# Patient Record
Sex: Female | Born: 1975 | Race: Black or African American | Hispanic: No | Marital: Single | Smoking: Former smoker
Health system: Southern US, Community
[De-identification: ages and names within clinical notes are randomized; demographics above are authoritative.]

## PROBLEM LIST (undated history)

## (undated) DIAGNOSIS — M419 Scoliosis, unspecified: Secondary | ICD-10-CM

## (undated) DIAGNOSIS — Z789 Other specified health status: Secondary | ICD-10-CM

## (undated) HISTORY — PX: NO PAST SURGERIES: SHX2092

---

## 1998-10-03 ENCOUNTER — Encounter: Payer: Self-pay | Admitting: Emergency Medicine

## 1998-10-03 ENCOUNTER — Emergency Department (HOSPITAL_COMMUNITY): Admission: EM | Admit: 1998-10-03 | Discharge: 1998-10-03 | Payer: Self-pay | Admitting: Emergency Medicine

## 1998-10-30 ENCOUNTER — Emergency Department (HOSPITAL_COMMUNITY): Admission: EM | Admit: 1998-10-30 | Discharge: 1998-10-30 | Payer: Self-pay | Admitting: Emergency Medicine

## 1999-05-26 ENCOUNTER — Inpatient Hospital Stay (HOSPITAL_COMMUNITY): Admission: AD | Admit: 1999-05-26 | Discharge: 1999-05-26 | Payer: Self-pay | Admitting: Obstetrics

## 1999-05-28 ENCOUNTER — Inpatient Hospital Stay (HOSPITAL_COMMUNITY): Admission: AD | Admit: 1999-05-28 | Discharge: 1999-05-28 | Payer: Self-pay | Admitting: Obstetrics & Gynecology

## 1999-09-06 ENCOUNTER — Ambulatory Visit (HOSPITAL_COMMUNITY): Admission: RE | Admit: 1999-09-06 | Discharge: 1999-09-06 | Payer: Self-pay | Admitting: *Deleted

## 1999-10-19 ENCOUNTER — Ambulatory Visit (HOSPITAL_COMMUNITY): Admission: RE | Admit: 1999-10-19 | Discharge: 1999-10-19 | Payer: Self-pay | Admitting: *Deleted

## 1999-10-31 ENCOUNTER — Inpatient Hospital Stay (HOSPITAL_COMMUNITY): Admission: AD | Admit: 1999-10-31 | Discharge: 1999-10-31 | Payer: Self-pay | Admitting: *Deleted

## 1999-12-17 ENCOUNTER — Inpatient Hospital Stay (HOSPITAL_COMMUNITY): Admission: AD | Admit: 1999-12-17 | Discharge: 1999-12-19 | Payer: Self-pay | Admitting: *Deleted

## 1999-12-21 ENCOUNTER — Emergency Department (HOSPITAL_COMMUNITY): Admission: EM | Admit: 1999-12-21 | Discharge: 1999-12-21 | Payer: Self-pay | Admitting: Emergency Medicine

## 2000-10-12 ENCOUNTER — Inpatient Hospital Stay (HOSPITAL_COMMUNITY): Admission: AD | Admit: 2000-10-12 | Discharge: 2000-10-12 | Payer: Self-pay | Admitting: Obstetrics & Gynecology

## 2000-12-25 ENCOUNTER — Encounter: Payer: Self-pay | Admitting: Obstetrics

## 2000-12-25 ENCOUNTER — Inpatient Hospital Stay (HOSPITAL_COMMUNITY): Admission: AD | Admit: 2000-12-25 | Discharge: 2000-12-25 | Payer: Self-pay | Admitting: Obstetrics

## 2001-01-02 ENCOUNTER — Encounter (HOSPITAL_COMMUNITY): Admission: RE | Admit: 2001-01-02 | Discharge: 2001-02-01 | Payer: Self-pay | Admitting: *Deleted

## 2001-02-06 ENCOUNTER — Encounter (HOSPITAL_COMMUNITY): Admission: RE | Admit: 2001-02-06 | Discharge: 2001-02-12 | Payer: Self-pay | Admitting: *Deleted

## 2001-02-09 ENCOUNTER — Inpatient Hospital Stay (HOSPITAL_COMMUNITY): Admission: AD | Admit: 2001-02-09 | Discharge: 2001-02-11 | Payer: Self-pay | Admitting: Obstetrics & Gynecology

## 2006-04-19 ENCOUNTER — Emergency Department: Payer: Self-pay | Admitting: Emergency Medicine

## 2007-06-23 ENCOUNTER — Emergency Department (HOSPITAL_COMMUNITY): Admission: EM | Admit: 2007-06-23 | Discharge: 2007-06-23 | Payer: Self-pay | Admitting: Emergency Medicine

## 2008-12-21 ENCOUNTER — Emergency Department (HOSPITAL_COMMUNITY): Admission: EM | Admit: 2008-12-21 | Discharge: 2008-12-21 | Payer: Self-pay | Admitting: Family Medicine

## 2009-02-17 ENCOUNTER — Ambulatory Visit (HOSPITAL_COMMUNITY): Admission: AD | Admit: 2009-02-17 | Discharge: 2009-02-17 | Payer: Self-pay | Admitting: Obstetrics & Gynecology

## 2009-02-17 ENCOUNTER — Encounter: Payer: Self-pay | Admitting: Obstetrics & Gynecology

## 2009-02-17 ENCOUNTER — Ambulatory Visit: Payer: Self-pay | Admitting: Obstetrics & Gynecology

## 2010-01-20 ENCOUNTER — Emergency Department (HOSPITAL_COMMUNITY): Admission: EM | Admit: 2010-01-20 | Discharge: 2010-01-20 | Payer: Self-pay | Admitting: Family Medicine

## 2010-08-29 NOTE — L&D Delivery Note (Signed)
Delivery Note Called for delivery attendance while other provider was occupied elsewhere. Patient noted to be completely dilated with urge to push.   At 10:08 AM a viable female was delivered via Vaginal, Spontaneous Delivery (Presentation: Right Occiput Anterior).  APGAR: 9, 9; weight 6 lb 14.4 oz (3130 g).   Placenta status: Intact, Spontaneous.  Cord: 3 vessels with the following complications: None.  Cord pH: n/a  There was no difficulty with the delivery of the shoulders.   Anesthesia: None  Episiotomy: None Lacerations: None Suture Repair: n/a Est. Blood Loss (mL):   Mom to postpartum.  Baby to nursery-stable.  [facility] 06/17/2011, 3:48 PM

## 2010-09-04 ENCOUNTER — Emergency Department (HOSPITAL_COMMUNITY)
Admission: EM | Admit: 2010-09-04 | Discharge: 2010-09-04 | Payer: Self-pay | Source: Home / Self Care | Admitting: Emergency Medicine

## 2010-09-13 LAB — URINALYSIS, ROUTINE W REFLEX MICROSCOPIC
Bilirubin Urine: NEGATIVE
Hgb urine dipstick: NEGATIVE
Ketones, ur: 15 mg/dL — AB
Nitrite: NEGATIVE
Protein, ur: NEGATIVE mg/dL
Specific Gravity, Urine: 1.023 (ref 1.005–1.030)
Urine Glucose, Fasting: NEGATIVE mg/dL
Urobilinogen, UA: 0.2 mg/dL (ref 0.0–1.0)
pH: 6 (ref 5.0–8.0)

## 2010-11-15 LAB — WET PREP, GENITAL: Yeast Wet Prep HPF POC: NONE SEEN

## 2010-11-15 LAB — POCT URINALYSIS DIP (DEVICE)
Glucose, UA: NEGATIVE mg/dL
Ketones, ur: NEGATIVE mg/dL
Nitrite: NEGATIVE
Protein, ur: 30 mg/dL — AB
Specific Gravity, Urine: >=1.03 (ref 1.005–1.030)
Urobilinogen, UA: 0.2 mg/dL (ref 0.0–1.0)
pH: 6 (ref 5.0–8.0)

## 2010-11-15 LAB — URINE CULTURE
Colony Count: NO GROWTH
Culture: NO GROWTH

## 2010-11-15 LAB — POCT PREGNANCY, URINE: Preg Test, Ur: NEGATIVE

## 2010-11-15 LAB — GC/CHLAMYDIA PROBE AMP, GENITAL
Chlamydia, DNA Probe: NEGATIVE
GC Probe Amp, Genital: NEGATIVE

## 2010-12-06 LAB — URINALYSIS, ROUTINE W REFLEX MICROSCOPIC
Bilirubin Urine: NEGATIVE
Glucose, UA: NEGATIVE mg/dL
Ketones, ur: >80 mg/dL — AB
Nitrite: NEGATIVE
Protein, ur: 30 mg/dL — AB
Specific Gravity, Urine: >1.03 — ABNORMAL HIGH (ref 1.005–1.030)
Urobilinogen, UA: 0.2 mg/dL (ref 0.0–1.0)
pH: 6 (ref 5.0–8.0)

## 2010-12-06 LAB — HERPES SIMPLEX VIRUS CULTURE: Culture: NOT DETECTED

## 2010-12-06 LAB — COMPREHENSIVE METABOLIC PANEL
ALT: 47 U/L — ABNORMAL HIGH (ref 0–35)
AST: 35 U/L (ref 0–37)
Albumin: 4.2 g/dL (ref 3.5–5.2)
Alkaline Phosphatase: 63 U/L (ref 39–117)
BUN: 7 mg/dL (ref 6–23)
CO2: 25 mEq/L (ref 19–32)
Calcium: 9.9 mg/dL (ref 8.4–10.5)
Chloride: 105 mEq/L (ref 96–112)
Creatinine, Ser: 0.42 mg/dL (ref 0.4–1.2)
GFR calc Af Amer: >60 mL/min (ref >60)
GFR calc non Af Amer: >60 mL/min (ref >60)
Glucose, Bld: 91 mg/dL (ref 70–99)
Potassium: 3.6 mEq/L (ref 3.5–5.1)
Sodium: 139 mEq/L (ref 135–145)
Total Bilirubin: 0.7 mg/dL (ref 0.3–1.2)
Total Protein: 7.5 g/dL (ref 6.0–8.3)

## 2010-12-06 LAB — DIFFERENTIAL
Basophils Absolute: 0.1 10*3/uL (ref 0.0–0.1)
Basophils Relative: 1 % (ref 0–1)
Eosinophils Absolute: 0.2 10*3/uL (ref 0.0–0.7)
Eosinophils Relative: 3 % (ref 0–5)
Lymphocytes Relative: 34 % (ref 12–46)
Lymphs Abs: 1.9 10*3/uL (ref 0.7–4.0)
Monocytes Absolute: 0.6 10*3/uL (ref 0.1–1.0)
Monocytes Relative: 10 % (ref 3–12)
Neutro Abs: 2.9 10*3/uL (ref 1.7–7.7)
Neutrophils Relative %: 52 % (ref 43–77)

## 2010-12-06 LAB — GC/CHLAMYDIA PROBE AMP, GENITAL
Chlamydia, DNA Probe: NEGATIVE
GC Probe Amp, Genital: NEGATIVE

## 2010-12-06 LAB — URINE MICROSCOPIC-ADD ON

## 2010-12-06 LAB — CBC
HCT: 38 % (ref 36.0–46.0)
Hemoglobin: 12.9 g/dL (ref 12.0–15.0)
MCHC: 33.9 g/dL (ref 30.0–36.0)
MCV: 85.8 fL (ref 78.0–100.0)
Platelets: 269 10*3/uL (ref 150–400)
RBC: 4.43 MIL/uL (ref 3.87–5.11)
RDW: 13.9 % (ref 11.5–15.5)
WBC: 5.6 10*3/uL (ref 4.0–10.5)

## 2010-12-06 LAB — WET PREP, GENITAL: Yeast Wet Prep HPF POC: NONE SEEN

## 2010-12-06 LAB — ABO/RH: ABO/RH(D): B POS

## 2010-12-06 LAB — HCG, QUANTITATIVE, PREGNANCY: hCG, Beta Chain, Quant, S: 9 m[IU]/mL — ABNORMAL HIGH (ref <5)

## 2010-12-08 LAB — POCT URINALYSIS DIP (DEVICE)
Glucose, UA: NEGATIVE mg/dL
Protein, ur: NEGATIVE mg/dL
Specific Gravity, Urine: 1.015 (ref 1.005–1.030)
Urobilinogen, UA: 1 mg/dL (ref 0.0–1.0)
pH: 7 (ref 5.0–8.0)

## 2010-12-08 LAB — URINE CULTURE

## 2010-12-29 ENCOUNTER — Other Ambulatory Visit: Payer: Self-pay | Admitting: Family Medicine

## 2010-12-29 DIAGNOSIS — Z3689 Encounter for other specified antenatal screening: Secondary | ICD-10-CM

## 2010-12-29 LAB — GC/CHLAMYDIA PROBE AMP, GENITAL: Chlamydia: NEGATIVE

## 2010-12-29 LAB — RUBELLA ANTIBODY, IGM: Rubella: IMMUNE

## 2010-12-29 LAB — ANTIBODY SCREEN: Antibody Screen: NEGATIVE

## 2011-01-11 NOTE — Op Note (Signed)
NAME:  Kristi Kelley, Kristi Kelley          ACCOUNT NO.:  [id number]   MEDICAL RECORD NO.:  [id number]          PATIENT TYPE:  AMB   LOCATION:  [facility]                          FACILITY:  [facility]   PHYSICIAN:  Lesly Dukes, M.D. DATE OF BIRTH:  1975-10-16   DATE OF PROCEDURE:  02/17/2009  DATE OF DISCHARGE:  02/17/2009                               OPERATIVE REPORT   REASON FOR PROCEDURE:  The patient is a 35 year old female, who had a  positive pregnancy test in April 2010 and never got prenatal care.  The  patient comes in today bleeding with severe abdominal pain and open os.  Ultrasound shows thickened endometrium.  It was likely retained products  of conception.   PREOPERATIVE DIAGNOSIS:  A 35 year old female with G3, P2 with retained  products of conception.   POSTOPERATIVE DIAGNOSIS:  A 35 year old female with G3, P2 with retained  products of conception.   PROCEDURE:  Dilation and evacuation.   SURGEON:  Lesly Dukes, MD   ANESTHESIA:  MAC and local.   FINDINGS:  Slightly enlarged uterus with the open os, anteverted uterus,  no adnexal masses.   PATHOLOGY:  Endometrial curettings to pathology.   ESTIMATED BLOOD LOSS:  Minimal.   COMPLICATIONS:  None.   PROCEDURE:  After informed consent was obtained, the patient was taken  to the operating room where MAC anesthesia was induced.  The patient  placed in dorsal lithotomy position and was prepared and draped in  normal sterile fashion.  The bladder was emptied.  A bivalve speculum  was placed into the patient's vagina and the anterior lip of the cervix  grasped with a single-tooth tenaculum.  The cervical os was already  dilated and 7.5 Hegar dilator easily fit through teh internal os.  A  curved 7 suction curette was gently introduced to the uterus and gentle  suction curettage was performed and sharp curette was then gently  introduced into the uterus and there was a good cry in all sides of the  uterine wall in all 4  uterine walls.  Suction curette was then done to  ensure that all products of conception were removed from the uterus.  All instruments were removed from the vagina.  There was good hemostasis  on the cervix.  The patient tolerated the procedure well.  Sponge, lap,  instrument, and needle count were correct x2.  The patient went to  recovery room in stable condition.     Lesly Dukes, M.D.  Electronically Signed    KHL/MEDQ  D:  02/17/2009  T:  02/18/2009  Job:  [id number]

## 2011-01-12 ENCOUNTER — Ambulatory Visit (HOSPITAL_COMMUNITY)
Admission: RE | Admit: 2011-01-12 | Discharge: 2011-01-12 | Disposition: A | Discharge status: Home / Self Care | Payer: Medicaid Other | Source: Ambulatory Visit | Attending: Family Medicine | Admitting: Family Medicine

## 2011-01-12 DIAGNOSIS — Z363 Encounter for antenatal screening for malformations: Secondary | ICD-10-CM | POA: Insufficient documentation

## 2011-01-12 DIAGNOSIS — O99330 Smoking (tobacco) complicating pregnancy, unspecified trimester: Secondary | ICD-10-CM | POA: Insufficient documentation

## 2011-01-12 DIAGNOSIS — O358XX0 Maternal care for other (suspected) fetal abnormality and damage, not applicable or unspecified: Secondary | ICD-10-CM | POA: Insufficient documentation

## 2011-01-12 DIAGNOSIS — Z3689 Encounter for other specified antenatal screening: Secondary | ICD-10-CM

## 2011-01-12 DIAGNOSIS — Z1389 Encounter for screening for other disorder: Secondary | ICD-10-CM | POA: Insufficient documentation

## 2011-05-31 ENCOUNTER — Encounter (HOSPITAL_COMMUNITY): Payer: Self-pay | Admitting: *Deleted

## 2011-06-07 ENCOUNTER — Encounter (HOSPITAL_COMMUNITY): Payer: Self-pay | Admitting: Anesthesiology

## 2011-06-07 ENCOUNTER — Encounter (HOSPITAL_COMMUNITY)
Admission: AD | Disposition: A | Discharge status: Home / Self Care | Payer: Self-pay | Source: Ambulatory Visit | Attending: Family Medicine

## 2011-06-07 ENCOUNTER — Inpatient Hospital Stay (HOSPITAL_COMMUNITY)
Admission: AD | Admit: 2011-06-07 | Discharge: 2011-06-08 | DRG: 767 | Disposition: A | Discharge status: Home / Self Care | Payer: Medicaid Other | Source: Ambulatory Visit | Attending: Family Medicine | Admitting: Family Medicine

## 2011-06-07 ENCOUNTER — Inpatient Hospital Stay (HOSPITAL_COMMUNITY): Payer: Medicaid Other | Admitting: Anesthesiology

## 2011-06-07 ENCOUNTER — Encounter (HOSPITAL_COMMUNITY): Payer: Self-pay | Admitting: *Deleted

## 2011-06-07 DIAGNOSIS — Z302 Encounter for sterilization: Secondary | ICD-10-CM

## 2011-06-07 DIAGNOSIS — Z3480 Encounter for supervision of other normal pregnancy, unspecified trimester: Secondary | ICD-10-CM

## 2011-06-07 HISTORY — DX: Other specified health status: Z78.9

## 2011-06-07 HISTORY — PX: TUBAL LIGATION: SHX77

## 2011-06-07 LAB — CBC
HCT: 31.2 % — ABNORMAL LOW (ref 36.0–46.0)
Platelets: 255 10*3/uL (ref 150–400)
RDW: 15.1 % (ref 11.5–15.5)
WBC: 11.2 10*3/uL — ABNORMAL HIGH (ref 4.0–10.5)

## 2011-06-07 LAB — MRSA PCR SCREENING: MRSA by PCR: NEGATIVE

## 2011-06-07 SURGERY — LIGATION, FALLOPIAN TUBE, POSTPARTUM
Anesthesia: Choice | Laterality: Bilateral

## 2011-06-07 MED ORDER — TETANUS-DIPHTH-ACELL PERTUSSIS 5-2.5-18.5 LF-MCG/0.5 IM SUSP
0.5000 mL | Freq: Once | INTRAMUSCULAR | Status: AC
  Administered 2011-06-08: 0.5 mL via INTRAMUSCULAR
  Filled 2011-06-07: qty 0.5

## 2011-06-07 MED ORDER — WITCH HAZEL-GLYCERIN EX PADS: 1.0000 "application " | MEDICATED_PAD | CUTANEOUS | Status: DC | PRN

## 2011-06-07 MED ORDER — LACTATED RINGERS IV SOLN
INTRAVENOUS | Status: DC
  Administered 2011-06-07 (×2): via INTRAVENOUS

## 2011-06-07 MED ORDER — DIBUCAINE 1 % RE OINT: 1.0000 "application " | TOPICAL_OINTMENT | RECTAL | Status: DC | PRN

## 2011-06-07 MED ORDER — HYDROMORPHONE HCL 1 MG/ML IJ SOLN
INTRAMUSCULAR | Status: AC
  Filled 2011-06-07: qty 1

## 2011-06-07 MED ORDER — EPHEDRINE SULFATE 50 MG/ML IJ SOLN
INTRAMUSCULAR | Status: DC | PRN
  Administered 2011-06-07 (×2): 10 mg via INTRAVENOUS

## 2011-06-07 MED ORDER — SIMETHICONE 80 MG PO CHEW: 80.0000 mg | CHEWABLE_TABLET | ORAL | Status: DC | PRN

## 2011-06-07 MED ORDER — BUPIVACAINE HCL (PF) 0.25 % IJ SOLN
INTRAMUSCULAR | Status: DC | PRN
  Administered 2011-06-07: 8 mL

## 2011-06-07 MED ORDER — FAMOTIDINE 20 MG PO TABS
40.0000 mg | ORAL_TABLET | Freq: Once | ORAL | Status: AC
  Administered 2011-06-07: 40 mg via ORAL
  Filled 2011-06-07: qty 2

## 2011-06-07 MED ORDER — ONDANSETRON HCL 4 MG/2ML IJ SOLN
INTRAMUSCULAR | Status: DC | PRN
  Administered 2011-06-07: 4 mg via INTRAVENOUS

## 2011-06-07 MED ORDER — SENNOSIDES-DOCUSATE SODIUM 8.6-50 MG PO TABS: 2.0000 | ORAL_TABLET | Freq: Every day | ORAL | Status: DC

## 2011-06-07 MED ORDER — ONDANSETRON HCL 4 MG/2ML IJ SOLN: 4.0000 mg | INTRAMUSCULAR | Status: DC | PRN

## 2011-06-07 MED ORDER — INFLUENZA VIRUS VACC SPLIT PF IM SUSP
0.5000 mL | Freq: Once | INTRAMUSCULAR | Status: AC
  Administered 2011-06-08: 0.5 mL via INTRAMUSCULAR
  Filled 2011-06-07: qty 0.5

## 2011-06-07 MED ORDER — DIPHENHYDRAMINE HCL 25 MG PO CAPS: 25.0000 mg | ORAL_CAPSULE | Freq: Four times a day (QID) | ORAL | Status: DC | PRN

## 2011-06-07 MED ORDER — METOCLOPRAMIDE HCL 10 MG PO TABS
10.0000 mg | ORAL_TABLET | Freq: Once | ORAL | Status: AC
  Administered 2011-06-07: 10 mg via ORAL
  Filled 2011-06-07: qty 1

## 2011-06-07 MED ORDER — NALBUPHINE SYRINGE 5 MG/0.5 ML
10.0000 mg | INJECTION | Freq: Once | INTRAMUSCULAR | Status: AC
  Administered 2011-06-07: 10 mg via INTRAVENOUS
  Filled 2011-06-07 (×2): qty 0.5

## 2011-06-07 MED ORDER — FENTANYL CITRATE 0.05 MG/ML IJ SOLN
INTRAMUSCULAR | Status: AC
  Filled 2011-06-07: qty 2

## 2011-06-07 MED ORDER — LIDOCAINE IN DEXTROSE 5-7.5 % IV SOLN
INTRAVENOUS | Status: AC
  Filled 2011-06-07: qty 2

## 2011-06-07 MED ORDER — FENTANYL CITRATE 0.05 MG/ML IJ SOLN
INTRAMUSCULAR | Status: DC | PRN
  Administered 2011-06-07 (×2): 50 ug via INTRAVENOUS

## 2011-06-07 MED ORDER — ONDANSETRON HCL 4 MG PO TABS: 4.0000 mg | ORAL_TABLET | ORAL | Status: DC | PRN

## 2011-06-07 MED ORDER — IBUPROFEN 600 MG PO TABS
600.0000 mg | ORAL_TABLET | Freq: Four times a day (QID) | ORAL | Status: DC
  Administered 2011-06-07 – 2011-06-08 (×5): 600 mg via ORAL
  Filled 2011-06-07 (×5): qty 1

## 2011-06-07 MED ORDER — EPHEDRINE 5 MG/ML INJ
INTRAVENOUS | Status: AC
  Filled 2011-06-07: qty 10

## 2011-06-07 MED ORDER — MIDAZOLAM HCL 5 MG/5ML IJ SOLN
INTRAMUSCULAR | Status: DC | PRN
  Administered 2011-06-07 (×2): 1 mg via INTRAVENOUS

## 2011-06-07 MED ORDER — KETOROLAC TROMETHAMINE 30 MG/ML IJ SOLN
INTRAMUSCULAR | Status: AC
  Filled 2011-06-07: qty 1

## 2011-06-07 MED ORDER — BENZOCAINE-MENTHOL 20-0.5 % EX AERO: 1.0000 "application " | INHALATION_SPRAY | CUTANEOUS | Status: DC | PRN

## 2011-06-07 MED ORDER — OXYCODONE-ACETAMINOPHEN 5-325 MG PO TABS
1.0000 | ORAL_TABLET | ORAL | Status: DC | PRN
  Administered 2011-06-07 – 2011-06-08 (×6): 2 via ORAL
  Filled 2011-06-07: qty 1
  Filled 2011-06-07 (×5): qty 2

## 2011-06-07 MED ORDER — HYDROMORPHONE HCL 1 MG/ML IJ SOLN
0.2500 mg | INTRAMUSCULAR | Status: DC | PRN
  Administered 2011-06-07: 0.25 mg via INTRAVENOUS

## 2011-06-07 MED ORDER — PRENATAL PLUS 27-1 MG PO TABS
1.0000 | ORAL_TABLET | Freq: Every day | ORAL | Status: DC
Start: 2011-06-07 — End: 2011-06-08
  Administered 2011-06-08: 1 via ORAL
  Filled 2011-06-07: qty 1

## 2011-06-07 MED ORDER — LANOLIN HYDROUS EX OINT: TOPICAL_OINTMENT | CUTANEOUS | Status: DC | PRN

## 2011-06-07 MED ORDER — MIDAZOLAM HCL 2 MG/2ML IJ SOLN
INTRAMUSCULAR | Status: AC
  Filled 2011-06-07: qty 2

## 2011-06-07 MED ORDER — KETOROLAC TROMETHAMINE 60 MG/2ML IM SOLN
INTRAMUSCULAR | Status: DC | PRN
  Administered 2011-06-07: 30 mg via INTRAMUSCULAR

## 2011-06-07 MED ORDER — LACTATED RINGERS IV SOLN: INTRAVENOUS | Status: DC

## 2011-06-07 MED ORDER — ONDANSETRON HCL 4 MG/2ML IJ SOLN
INTRAMUSCULAR | Status: AC
  Filled 2011-06-07: qty 2

## 2011-06-07 MED ORDER — ZOLPIDEM TARTRATE 5 MG PO TABS: 5.0000 mg | ORAL_TABLET | Freq: Every evening | ORAL | Status: DC | PRN

## 2011-06-07 MED ORDER — BUTORPHANOL TARTRATE 2 MG/ML IJ SOLN
2.0000 mg | Freq: Once | INTRAMUSCULAR | Status: AC
  Administered 2011-06-07: 2 mg via INTRAVENOUS
  Filled 2011-06-07: qty 1

## 2011-06-07 SURGICAL SUPPLY — 20 items
CHLORAPREP W/TINT 26ML (MISCELLANEOUS) ×2 IMPLANT
CLIP FILSHIE TUBAL LIGA STRL (Clip) ×2 IMPLANT
CLOTH BEACON ORANGE TIMEOUT ST (SAFETY) ×2 IMPLANT
DRAPE UTILITY XL STRL (DRAPES) ×2 IMPLANT
GLOVE BIOGEL PI IND STRL 7.0 (GLOVE) ×1 IMPLANT
GLOVE BIOGEL PI INDICATOR 7.0 (GLOVE) ×1
GLOVE ECLIPSE 7.0 STRL STRAW (GLOVE) ×4 IMPLANT
GOWN PREVENTION PLUS LG XLONG (DISPOSABLE) ×2 IMPLANT
GOWN PREVENTION PLUS XLARGE (GOWN DISPOSABLE) ×2 IMPLANT
NEEDLE HYPO 22GX1.5 SAFETY (NEEDLE) ×2 IMPLANT
NS IRRIG 1000ML POUR BTL (IV SOLUTION) ×2 IMPLANT
PACK ABDOMINAL MINOR (CUSTOM PROCEDURE TRAY) ×2 IMPLANT
SPONGE LAP 4X18 X RAY DECT (DISPOSABLE) IMPLANT
SUT VIC AB 0 CT1 27 (SUTURE) ×1
SUT VIC AB 0 CT1 27XBRD ANBCTR (SUTURE) ×1 IMPLANT
SUT VICRYL 4-0 PS2 18IN ABS (SUTURE) ×2 IMPLANT
SYR CONTROL 10ML LL (SYRINGE) ×2 IMPLANT
TOWEL OR 17X24 6PK STRL BLUE (TOWEL DISPOSABLE) ×4 IMPLANT
TRAY FOLEY CATH 14FR (SET/KITS/TRAYS/PACK) ×2 IMPLANT
WATER STERILE IRR 1000ML POUR (IV SOLUTION) ×2 IMPLANT

## 2011-06-07 NOTE — Anesthesia Procedure Notes (Signed)
Spinal Block  Patient location during procedure: OR Preanesthetic Checklist Completed: patient identified, site marked, surgical consent, pre-op evaluation, timeout performed, IV checked, risks and benefits discussed and monitors and equipment checked Spinal Block Patient position: sitting Prep: DuraPrep Patient monitoring: heart rate, cardiac monitor, continuous pulse ox and blood pressure Approach: midline Location: L4-5 Injection technique: single-shot Needle Needle type: Sprotte  Needle gauge: 24 G Needle length: 9 cm Assessment Sensory level: T4 Additional Notes Attempted at L3-4; got CSF but  No asp, and transient R leg parathesia with placement Re-prep and placed  at L45 as above Spinal Dosage in OR  Xylocaine ml       1.2

## 2011-06-07 NOTE — Procedures (Signed)
Kristi Kelley 06/07/2011  PREOPERATIVE DIAGNOSIS:  Multiparity, undesired fertility  POSTOPERATIVE DIAGNOSIS:  Multiparity, undesired fertility  PROCEDURE:  Postpartum Bilateral Tubal Sterilization using Filshie Clips   ANESTHESIA:  Spinal and local analgesia using 0.25% Bertram Gala, MD  COMPLICATIONS:  None immediate.  ESTIMATED BLOOD LOSS: 15 ml.  INDICATIONS: 35 y.o. [id number]  with undesired fertility,status post vaginal delivery, desires permanent sterilization. Risks of procedure discussed with patient including but not limited to: risk of regret, permanence of method, bleeding, infection, injury to surrounding organs and need for additional procedures.  Failure risk of 0.5-1% with increased risk of ectopic gestation if pregnancy occurs was also discussed with patient.     FINDINGS:  Normal uterus, tubes.  PROCEDURE DETAILS: The patient was taken to the operating room where her spinal anesthesia was dosed up to surgical level and found to be adequate.  She was then placed in a supine position and prepped and draped in the usual sterile fashion.  After an adequate timeout was performed, attention was turned to the patient's abdomen where a small transverse skin incision was made under the umbilical fold. The incision was taken down to the layer of fascia using the scalpel, and fascia was incised, and extended bilaterally. The peritoneum was entered in a sharp fashion. The patient was placed in Trendelenburg.  A moist lap pad was used to move omentum and bowel away until the left fallopian tube was identified and grasped with a Babcock clamp, and followed out to the fimbriated end.  A Filshie clip was placed on the left fallopian tube about 2 cm from the cornu.  A 2 cc of Marcaine were drizzled over the tube and it was allowed to return to the abdomen.  A similar process was carried out on the right side allowing for bilateral tubal sterilization.  Good hemostasis was noted overall.   Local analgesia was injected into both Filshie application sites.The instruments were then removed from the patient's abdomen and the fascial incision was repaired with 0 Vicryl, and the skin was closed with a 4-0 Vicryl subcuticular stitch. The patient tolerated the procedure well.  Sponge, lap, and needle counts were correct times two.  The patient was then taken to the recovery room awake, extubated and in stable condition.  Vivan Agostino S 06/07/2011 2:28 PM

## 2011-06-07 NOTE — H&P (Signed)
Kristi Kelley is a 35 y.o. female presenting for active labor. Maternal Medical History:  Reason for admission: Reason for admission: rupture of membranes.  Reason for Admission:   nauseaActive labor  Contractions: Onset was 3-5 hours ago.    Prenatal complications: No bleeding, hypertension, IUGR, placental abnormality or pre-eclampsia.   Prenatal Complications - Diabetes: none.    OB History    Grav Para Term Preterm Abortions TAB SAB Ect Mult Living   3 3 3  0 0 0 0 0 0 3     Past Medical History  Diagnosis Date  . No pertinent past medical history    Past Surgical History  Procedure Date  . No past surgeries    Family History: family history is not on file. Social History:  reports that she has been smoking.  She does not have any smokeless tobacco history on file. She reports that she does not drink alcohol or use illicit drugs.  Review of Systems  Constitutional: Negative for fever, chills and weight loss.  Eyes: Negative for blurred vision, double vision and photophobia.  Respiratory: Negative for cough and wheezing.   Cardiovascular: Negative for chest pain, palpitations and orthopnea.  Gastrointestinal: Negative for nausea and vomiting.  Genitourinary: Negative for dysuria and urgency.  Skin: Negative.   Neurological: Negative for sensory change, seizures and loss of consciousness.  Psychiatric/Behavioral: Negative for depression, suicidal ideas and substance abuse.    Dilation: Lip/rim Effacement (%): Thick (ant lip) Station: 0 Exam by:: jolynn Height 5' (1.524 m), weight 73.936 kg (163 lb), unknown if currently breastfeeding. Exam Physical Exam  Constitutional: She is oriented to person, place, and time. She appears well-developed and well-nourished. No distress.  Cardiovascular: Normal rate and regular rhythm.   Respiratory: Effort normal. No respiratory distress. She has no wheezes.  Neurological: She is alert and oriented to person, place, and time.  She has normal reflexes.  Skin: Skin is warm and dry.  Psychiatric: She has a normal mood and affect. Her behavior is normal. Judgment and thought content normal.    Prenatal labs: ABO, Rh: B/Positive/-- (05/02 0000) Antibody: Negative (05/02 0000) Rubella: Immune (05/02 0000) RPR: Nonreactive (05/02 0000)  HBsAg: Negative (05/02 0000)  HIV: Non-reactive (05/02 0000)  GBS: Negative (09/21 0000)   Assessment/Plan: 35yo [id number] status post SVD occuring at time of admission. NSAIDS for pain. Transfer to Mother Baby unit for observation.  Bilateral tubal ligation to be done during this admission.  Kristi Kelley 06/07/2011, 10:40 AM

## 2011-06-07 NOTE — Addendum Note (Signed)
Addendum  created 06/07/11 1517 by Keyira Mondesir L. Thai Burgueno   Modules edited:Orders

## 2011-06-07 NOTE — Anesthesia Postprocedure Evaluation (Signed)
Anesthesia Post Note  Patient: Kristi Kelley  Procedure(s) Performed:  POST PARTUM TUBAL LIGATION  Anesthesia type: Spinal  Patient location: PACU  Post pain: Pain level controlled  Post assessment: Post-op Vital signs reviewed  Last Vitals:  Filed Vitals:   06/07/11 1500  BP: 120/81  Pulse: 87  Temp:   Resp: 13    Post vital signs: Reviewed  Level of consciousness: awake  Complications: No apparent anesthesia complications

## 2011-06-07 NOTE — Progress Notes (Signed)
Patient counseled, r.e. Risks benefits of BTL, including permanency of procedure, risk of failure(1:100), increased risk of ectopic.  Patient verbalized understanding and desires to proceed    Patient counseled, r.e. Risks benefits of BTL, including permanency of procedure, risk of failure(1:100), increased risk of ectopic.  Patient verbalized understanding and desires to proceed.

## 2011-06-07 NOTE — Transfer of Care (Signed)
Immediate Anesthesia Transfer of Care Note  Patient: Kristi Kelley  Procedure(s) Performed:  POST PARTUM TUBAL LIGATION  Patient Location: PACU  Anesthesia Type: Spinal  Level of Consciousness: awake, alert  and oriented  Airway & Oxygen Therapy: Patient Spontanous Breathing  Post-op Assessment: Report given to PACU RN and Post -op Vital signs reviewed and stable  Post vital signs: Reviewed and stable  Complications: No apparent anesthesia complications

## 2011-06-07 NOTE — Anesthesia Preprocedure Evaluation (Addendum)
Anesthesia Evaluation  Name, MR# and DOB Patient awake  General Assessment Comment  Reviewed: Allergy & Precautions, H&P , Patient's Chart, lab work & pertinent test results  Airway Mallampati: II TM Distance: >3 FB Neck ROM: full    Dental No notable dental hx.    Pulmonary  clear to auscultation  Pulmonary exam normal       Cardiovascular Exercise Tolerance: Good regular Normal    Neuro/Psych    GI/Hepatic   Endo/Other    Renal/GU      Musculoskeletal   Abdominal   Peds  Hematology   Anesthesia Other Findings   Reproductive/Obstetrics                           Anesthesia Physical Anesthesia Plan  ASA: II  Anesthesia Plan: Spinal   Post-op Pain Management:    Induction:   Airway Management Planned:   Additional Equipment:   Intra-op Plan:   Post-operative Plan:   Informed Consent: I have reviewed the patients History and Physical, chart, labs and discussed the procedure including the risks, benefits and alternatives for the proposed anesthesia with the patient or authorized representative who has indicated his/her understanding and acceptance.   Dental Advisory Given  Plan Discussed with: CRNA  Anesthesia Plan Comments: (Lab work confirmed with CRNA in room. Platelets okay. Discussed spinal anesthetic, and patient consents to the procedure:  included risk of possible headache,backache, failed block, allergic reaction, and nerve injury. This patient was asked if she had any questions or concerns before the procedure started. )        Anesthesia Quick Evaluation  

## 2011-06-07 NOTE — H&P (Signed)
Pt seen and agree with above note. Infant was a viable female delivered by M. Mayford Knife and Pasty Arch PA-S prior to my arrival in the room. Placenta del spont intact by Katrinka Blazing, and inspection revealed no lacs. FF with lochia nl. Del note to be completed by M. Williams CNM. Cam Hai 06/07/2011 6:14 PM

## 2011-06-07 NOTE — Progress Notes (Signed)
UR Chart review completed.  

## 2011-06-08 ENCOUNTER — Encounter (HOSPITAL_COMMUNITY): Payer: Self-pay | Admitting: Family Medicine

## 2011-06-08 MED ORDER — OXYCODONE-ACETAMINOPHEN 5-325 MG PO TABS: 1.0000 | ORAL_TABLET | ORAL | Status: AC | PRN

## 2011-06-08 MED ORDER — IBUPROFEN 600 MG PO TABS: 600.0000 mg | ORAL_TABLET | Freq: Four times a day (QID) | ORAL | Status: AC

## 2011-06-08 NOTE — Progress Notes (Signed)
Encounter addended by: Isabella Bowens on: 06/08/2011  1:15 PM<BR>     Documentation filed: Notes Section

## 2011-06-08 NOTE — Discharge Summary (Signed)
Attestation of Attending Supervision of Advanced Practitioner: Evaluation and management procedures were performed by the PA/NP/CNM/OB Fellow under my supervision/collaboration. Chart reviewed and agree with management and plan.  Valery Chance A 06/08/2011 7:54 AM
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
## 2011-06-08 NOTE — Discharge Summary (Signed)
Obstetric Discharge Summary Reason for Admission: onset of labor Prenatal Procedures: none Intrapartum Procedures: spontaneous vaginal delivery Postpartum Procedures: P.P. tubal ligation Complications-Operative and Postpartum: none Hemoglobin  Date Value Range Status  06/07/2011 10.5* 12.0-15.0 (g/dL) Final     HCT  Date Value Range Status  06/07/2011 31.2* 36.0-46.0 (%) Final    Discharge Diagnoses: Term Pregnancy-delivered  Discharge Information: Date: 06/08/2011 Activity: pelvic rest Diet: routine Medications: PNV, Ibuprofen and Percocet Condition: stable Instructions: refer to practice specific booklet Discharge to: home Follow-up Information    Follow up with [facility] [place]. Make an appointment in 4 weeks.   Contact information:   [address] [employer] [place] [address]          Newborn Data: Live born female  Birth Weight: 6 lb 14.4 oz (3130 g) APGAR: 9, 9  Home with mother.  Cam Hai 06/08/2011, 7:37 AM

## 2011-06-08 NOTE — Anesthesia Postprocedure Evaluation (Signed)
  Anesthesia Post-op Note  Patient: Kristi Kelley  Procedure(s) Performed:  POST PARTUM TUBAL LIGATION  Patient Location: PACU and Women's Unit  Anesthesia Type: Epidural  Level of Consciousness: awake, alert  and oriented  Airway and Oxygen Therapy: Patient Spontanous Breathing  Post-op Pain: none  Post-op Assessment: Post-op Vital signs reviewed  Post-op Vital Signs: Reviewed and stable  Complications: No apparent anesthesia complications

## 2011-06-08 NOTE — Progress Notes (Signed)
Patient declined vaccine at this time

## 2011-06-08 NOTE — Progress Notes (Addendum)
Post Partum Day #1/ Post Op Day #1 Subjective: no complaints, up ad lib and + flatus; requesting d/c home today; breastfeeding well Objective: Blood pressure 105/67, pulse 84, temperature 98.3 F (36.8 C), temperature source Oral, resp. rate 18, height 5' (1.524 m), weight 73.936 kg (163 lb), SpO2 100.00%, unknown if currently breastfeeding.  Physical Exam:  General: alert and cooperative Lochia: appropriate Uterine Fundus: firm Incision: healing well; bandage present on umbilicus  Basename 06/07/11 1213  HGB 10.5*  HCT 31.2*    Assessment/Plan: Discharge home as long as pediatrician discharges baby   LOS: 1 day   Cam Hai 06/08/2011, 7:40 AM    Dictated d/c summary: #[id number]

## 2011-06-14 NOTE — Discharge Summary (Signed)
Chart reviewed and agree with management and plan.  

## 2011-06-17 ENCOUNTER — Encounter (HOSPITAL_COMMUNITY): Payer: Self-pay | Admitting: Advanced Practice Midwife

## 2011-06-20 ENCOUNTER — Telehealth: Payer: Self-pay | Admitting: *Deleted

## 2011-06-20 NOTE — Telephone Encounter (Signed)
Pt called w/complaint of back, neck and shoulder pain. She has tried hot compresses and pain meds and is not getting any relief. Pt had PP BTL on 06/07/11.  I explained to pt that we do not have any available appts within the next several days and I advised pt to go to [facility]U for evaluation. Pt voiced understanding.

## 2012-01-06 ENCOUNTER — Encounter (HOSPITAL_COMMUNITY): Payer: Self-pay | Admitting: Emergency Medicine

## 2012-01-06 ENCOUNTER — Emergency Department (HOSPITAL_COMMUNITY)
Admission: EM | Admit: 2012-01-06 | Discharge: 2012-01-06 | Disposition: A | Discharge status: Home / Self Care | Payer: Medicaid Other | Source: Home / Self Care | Attending: Family Medicine | Admitting: Family Medicine

## 2012-01-06 DIAGNOSIS — N390 Urinary tract infection, site not specified: Secondary | ICD-10-CM

## 2012-01-06 LAB — POCT URINALYSIS DIP (DEVICE)
Nitrite: NEGATIVE
Protein, ur: 30 mg/dL — AB
Urobilinogen, UA: 0.2 mg/dL (ref 0.0–1.0)
pH: 6.5 (ref 5.0–8.0)

## 2012-01-06 MED ORDER — HYDROCODONE-ACETAMINOPHEN 5-325 MG PO TABS
ORAL_TABLET | ORAL | Status: AC
  Filled 2012-01-06: qty 1

## 2012-01-06 MED ORDER — HYDROCODONE-ACETAMINOPHEN 5-325 MG PO TABS
1.0000 | ORAL_TABLET | Freq: Once | ORAL | Status: AC
  Administered 2012-01-06: 1 via ORAL

## 2012-01-06 MED ORDER — CIPROFLOXACIN HCL 500 MG PO TABS: 500.0000 mg | ORAL_TABLET | Freq: Two times a day (BID) | ORAL | Status: AC

## 2012-01-06 MED ORDER — IBUPROFEN 600 MG PO TABS: 600.0000 mg | ORAL_TABLET | Freq: Three times a day (TID) | ORAL | Status: AC

## 2012-01-06 MED ORDER — HYDROCODONE-ACETAMINOPHEN 5-500 MG PO TABS: 1.0000 | ORAL_TABLET | Freq: Four times a day (QID) | ORAL | Status: AC | PRN

## 2012-01-06 MED ORDER — ONDANSETRON 4 MG PO TBDP: 4.0000 mg | ORAL_TABLET | Freq: Three times a day (TID) | ORAL | Status: AC | PRN

## 2012-01-06 NOTE — ED Notes (Signed)
PT HERE WITH LOWER ABD PRESSURE AND PAIN WITH NAUSEA AND X 1 EMESIS YESTERDAY.SX STARTED X 3 DYS AGO.PT S/P POST PARTUM.NO FEVER,CHILLS NOTED.LMP 12/12/11

## 2012-01-06 NOTE — Discharge Instructions (Signed)
Keep well-hydrated. Take the prescribed medications as instructed. Take ibuprofen with food as it can upset your stomach. Be aware that Vicodin can make you drowsy and he should not drive after taking this medication. Return to the emergency department if new onset of fever or recurrent vomiting or worsening symptoms despite following treatment.

## 2012-01-08 LAB — URINE CULTURE

## 2012-01-10 NOTE — ED Provider Notes (Signed)
History     CSN: [id number]  Arrival date & time 01/06/12  4098   First MD Initiated Contact with Patient 01/06/12 1927      Chief Complaint  Patient presents with  . Abdominal Pain  . GI Problem    (Consider location/radiation/quality/duration/timing/severity/associated sxs/prior treatment) HPI Comments: 36 y/o smoker female 7 months post partum by NVD here c/o burning on urination, suprapubic pressure, frequency and nausea for 3 days and 1 episode of emesis yesterday, food content. No vomting today. Able to tolerated solids and liquids. No diarrhea. About to start with her period. LMP 12/12/11, S/P BTL. Normal BM. Denies vaginal discharge.    Patient is a 36 y.o. female presenting with GI illlness.  GI Problem  Associated symptoms include abdominal pain. Pertinent negatives include no vomiting, no chills, no headaches and no cough.    Past Medical History  Diagnosis Date  . No pertinent past medical history     Past Surgical History  Procedure Date  . No past surgeries   . Tubal ligation 06/07/2011    Procedure: POST PARTUM TUBAL LIGATION;  Surgeon: Reva Bores, MD;  Location: [facility];  Service: Gynecology;  Laterality: Bilateral;    No family history on file.  History  Substance Use Topics  . Smoking status: Current Everyday Smoker -- 0.5 packs/day  . Smokeless tobacco: Not on file  . Alcohol Use: No    OB History    Grav Para Term Preterm Abortions TAB SAB Ect Mult Living   4 3 3  0 0 0 0 0 0 3      Review of Systems  Constitutional: Negative for fever and chills.  HENT: Negative for congestion, sore throat and neck pain.   Respiratory: Negative for cough.   Gastrointestinal: Positive for nausea and abdominal pain. Negative for vomiting and diarrhea.  Genitourinary: Positive for dysuria and frequency. Negative for hematuria, flank pain, vaginal bleeding, vaginal discharge, menstrual problem and pelvic pain.  Skin: Negative for rash.  Neurological: Negative  for dizziness and headaches.    Allergies  Review of patient's allergies indicates no known allergies.  Home Medications   Current Outpatient Rx  Name Route Sig Dispense Refill  . CIPROFLOXACIN HCL 500 MG PO TABS Oral Take 1 tablet (500 mg total) by mouth every 12 (twelve) hours. 10 tablet 0  . HYDROCODONE-ACETAMINOPHEN 5-500 MG PO TABS Oral Take 1 tablet by mouth every 6 (six) hours as needed for pain. 10 tablet 0  . IBUPROFEN 600 MG PO TABS Oral Take 1 tablet (600 mg total) by mouth 3 (three) times daily. 20 tablet 0  . ONDANSETRON 4 MG PO TBDP Oral Take 1 tablet (4 mg total) by mouth every 8 (eight) hours as needed for nausea. 10 tablet 0  . PRENATAL PLUS 27-1 MG PO TABS Oral Take 1 tablet by mouth daily.        BP 136/90  Pulse 75  Temp(Src) 98.1 F (36.7 C) (Oral)  Resp 18  SpO2 97%  LMP 12/12/2011  Breastfeeding? Unknown  Physical Exam  Nursing note and vitals reviewed. Constitutional: She is oriented to person, place, and time. She appears well-developed and well-nourished. No distress.  HENT:  Head: Normocephalic.  Mouth/Throat: Oropharynx is clear and moist. No oropharyngeal exudate.  Eyes: Conjunctivae are normal. Pupils are equal, round, and reactive to light. No scleral icterus.  Neck: Neck supple. No thyromegaly present.  Cardiovascular: Normal heart sounds.   Pulmonary/Chest: Breath sounds normal.  Abdominal: Soft. Bowel sounds  are normal. She exhibits no distension and no mass. There is no tenderness. There is no rebound and no guarding.       No CVT  Lymphadenopathy:    She has no cervical adenopathy.  Neurological: She is alert and oriented to person, place, and time.  Skin: No rash noted.    ED Course  Procedures (including critical care time)  Labs Reviewed  POCT URINALYSIS DIP (DEVICE) - Abnormal; Notable for the following:    Bilirubin Urine SMALL (*)    Ketones, ur TRACE (*)    Hgb urine dipstick TRACE (*)    Protein, ur 30 (*)    Leukocytes,  UA SMALL (*) Biochemical Testing Only. Please order routine urinalysis from main lab if confirmatory testing is needed.   All other components within normal limits  POCT PREGNANCY, URINE  URINE CULTURE  LAB REPORT - SCANNED   No results found.   1. Urinary tract infection       MDM  UTI vs GI virus.abnormal urine vs prodromic menstrual symptoms. Benign abdominal exam.Prescribed Cipro bid for 5 days, symptomatic treatment with Vicodin and ondansetron. Asked to return if worsening symptoms or new onset of fever despite following treatment. Urine culture pending.        Sharin Grave, MD 01/10/12 1112

## 2012-12-31 ENCOUNTER — Encounter: Payer: Self-pay | Admitting: *Deleted

## 2013-08-25 ENCOUNTER — Encounter (HOSPITAL_COMMUNITY): Payer: Self-pay | Admitting: Emergency Medicine

## 2013-08-25 ENCOUNTER — Emergency Department (HOSPITAL_COMMUNITY)
Admission: EM | Admit: 2013-08-25 | Discharge: 2013-08-25 | Disposition: A | Discharge status: Home / Self Care | Payer: Medicaid Other | Attending: Emergency Medicine | Admitting: Emergency Medicine

## 2013-08-25 ENCOUNTER — Ambulatory Visit (HOSPITAL_COMMUNITY)
Admission: AD | Admit: 2013-08-25 | Discharge: 2013-08-25 | Disposition: A | Discharge status: Home / Self Care | Payer: Medicaid Other | Attending: Psychiatry | Admitting: Psychiatry

## 2013-08-25 ENCOUNTER — Emergency Department (HOSPITAL_COMMUNITY)
Admission: EM | Admit: 2013-08-25 | Discharge: 2013-08-26 | Disposition: A | Discharge status: Other Acute Inpatient Hospital | Payer: Medicaid Other | Attending: Emergency Medicine | Admitting: Emergency Medicine

## 2013-08-25 DIAGNOSIS — K002 Abnormalities of size and form of teeth: Secondary | ICD-10-CM | POA: Insufficient documentation

## 2013-08-25 DIAGNOSIS — F411 Generalized anxiety disorder: Secondary | ICD-10-CM | POA: Insufficient documentation

## 2013-08-25 DIAGNOSIS — K029 Dental caries, unspecified: Secondary | ICD-10-CM | POA: Insufficient documentation

## 2013-08-25 DIAGNOSIS — H9209 Otalgia, unspecified ear: Secondary | ICD-10-CM | POA: Insufficient documentation

## 2013-08-25 DIAGNOSIS — K0889 Other specified disorders of teeth and supporting structures: Secondary | ICD-10-CM

## 2013-08-25 DIAGNOSIS — K089 Disorder of teeth and supporting structures, unspecified: Secondary | ICD-10-CM | POA: Insufficient documentation

## 2013-08-25 DIAGNOSIS — F17200 Nicotine dependence, unspecified, uncomplicated: Secondary | ICD-10-CM | POA: Insufficient documentation

## 2013-08-25 DIAGNOSIS — F3289 Other specified depressive episodes: Secondary | ICD-10-CM | POA: Insufficient documentation

## 2013-08-25 DIAGNOSIS — Z3202 Encounter for pregnancy test, result negative: Secondary | ICD-10-CM | POA: Insufficient documentation

## 2013-08-25 DIAGNOSIS — R110 Nausea: Secondary | ICD-10-CM

## 2013-08-25 DIAGNOSIS — K920 Hematemesis: Secondary | ICD-10-CM

## 2013-08-25 DIAGNOSIS — R51 Headache: Secondary | ICD-10-CM | POA: Insufficient documentation

## 2013-08-25 DIAGNOSIS — M4120 Other idiopathic scoliosis, site unspecified: Secondary | ICD-10-CM | POA: Insufficient documentation

## 2013-08-25 DIAGNOSIS — Z792 Long term (current) use of antibiotics: Secondary | ICD-10-CM | POA: Insufficient documentation

## 2013-08-25 DIAGNOSIS — R112 Nausea with vomiting, unspecified: Secondary | ICD-10-CM | POA: Insufficient documentation

## 2013-08-25 DIAGNOSIS — Z79899 Other long term (current) drug therapy: Secondary | ICD-10-CM | POA: Insufficient documentation

## 2013-08-25 DIAGNOSIS — IMO0002 Reserved for concepts with insufficient information to code with codable children: Secondary | ICD-10-CM | POA: Insufficient documentation

## 2013-08-25 DIAGNOSIS — F329 Major depressive disorder, single episode, unspecified: Secondary | ICD-10-CM

## 2013-08-25 HISTORY — DX: Scoliosis, unspecified: M41.9

## 2013-08-25 LAB — POCT I-STAT, CHEM 8
Chloride: 103 mEq/L (ref 96–112)
HCT: 43 % (ref 36.0–46.0)
Hemoglobin: 14.6 g/dL (ref 12.0–15.0)
Potassium: 4.2 mEq/L (ref 3.5–5.1)
Sodium: 136 mEq/L (ref 135–145)

## 2013-08-25 LAB — CBC WITH DIFFERENTIAL/PLATELET
Basophils Absolute: 0 10*3/uL (ref 0.0–0.1)
Basophils Relative: 0 % (ref 0–1)
Eosinophils Absolute: 0.1 10*3/uL (ref 0.0–0.7)
HCT: 39.8 % (ref 36.0–46.0)
Hemoglobin: 13.6 g/dL (ref 12.0–15.0)
Lymphocytes Relative: 35 % (ref 12–46)
MCHC: 34.2 g/dL (ref 30.0–36.0)
Monocytes Relative: 11 % (ref 3–12)
Neutrophils Relative %: 52 % (ref 43–77)
WBC: 5.5 10*3/uL (ref 4.0–10.5)

## 2013-08-25 LAB — CBC
Hemoglobin: 13.5 g/dL (ref 12.0–15.0)
Platelets: 258 10*3/uL (ref 150–400)
RBC: 4.64 MIL/uL (ref 3.87–5.11)
WBC: 8.2 10*3/uL (ref 4.0–10.5)

## 2013-08-25 MED ORDER — PANTOPRAZOLE SODIUM 40 MG PO TBEC: 40.0000 mg | DELAYED_RELEASE_TABLET | Freq: Once | ORAL | Status: DC

## 2013-08-25 MED ORDER — ONDANSETRON HCL 8 MG PO TABS: 8.0000 mg | ORAL_TABLET | Freq: Three times a day (TID) | ORAL | Status: DC | PRN

## 2013-08-25 MED ORDER — OXYCODONE-ACETAMINOPHEN 5-325 MG PO TABS: 1.0000 | ORAL_TABLET | ORAL | Status: DC | PRN

## 2013-08-25 MED ORDER — OXYCODONE-ACETAMINOPHEN 5-325 MG PO TABS
1.0000 | ORAL_TABLET | Freq: Once | ORAL | Status: AC
  Administered 2013-08-25: 1 via ORAL
  Filled 2013-08-25: qty 1

## 2013-08-25 MED ORDER — ONDANSETRON 4 MG PO TBDP
8.0000 mg | ORAL_TABLET | Freq: Once | ORAL | Status: AC
  Administered 2013-08-25: 8 mg via ORAL
  Filled 2013-08-25: qty 2

## 2013-08-25 MED ORDER — KETOROLAC TROMETHAMINE 60 MG/2ML IM SOLN
60.0000 mg | Freq: Once | INTRAMUSCULAR | Status: AC
  Administered 2013-08-25: 60 mg via INTRAMUSCULAR
  Filled 2013-08-25: qty 2

## 2013-08-25 MED ORDER — AMOXICILLIN 500 MG PO CAPS: 500.0000 mg | ORAL_CAPSULE | Freq: Three times a day (TID) | ORAL | Status: DC

## 2013-08-25 MED ORDER — PANTOPRAZOLE SODIUM 20 MG PO TBEC: 20.0000 mg | DELAYED_RELEASE_TABLET | Freq: Every day | ORAL | Status: DC

## 2013-08-25 NOTE — ED Provider Notes (Signed)
Medical screening examination/treatment/procedure(s) were performed by non-physician practitioner and as supervising physician I was immediately available for consultation/collaboration.     Geoffery Lyons, MD 08/25/13 [phone]

## 2013-08-25 NOTE — ED Provider Notes (Signed)
CSN: [id number]     Arrival date & time 08/25/13  0831 History   First MD Initiated Contact with Patient 08/25/13 [phone]     Chief Complaint  Patient presents with  . Hematemesis  . Otalgia   (Consider location/radiation/quality/duration/timing/severity/associated sxs/prior Treatment) HPI Kristi Kelley is a 37 y.o. female who presents to emergency department complaining of right upper dental pain, headache, nausea, vomiting, hematemesis. Patient states that she has had nausea and vomiting with streaks of blood in it for the last 3 days. She states that she thought she had a viral gastroenteritis, states her baby was sick with flulike symptoms. She states that she hasn't been able to keep anything down the last 2 days. States yesterday he developed right-sided facial pain. States she has a broken off tooth in she's unable to bite down on her tooth. She states that overnight the pain worsened and now states entire right side of the face is painful. Patient denies any fever. She states that nothing is making her symptoms better or worse. She took ibuprofen with no relief. She states it's painful to open her mouth. She denies any sore throat or difficulty swallowing. She denies any chest pain or abdominal pain. She denies any cough.  Past Medical History  Diagnosis Date  . No pertinent past medical history    Past Surgical History  Procedure Laterality Date  . No past surgeries    . Tubal ligation  06/07/2011    Procedure: POST PARTUM TUBAL LIGATION;  Surgeon: Reva Bores, MD;  Location: [facility];  Service: Gynecology;  Laterality: Bilateral;   History reviewed. No pertinent family history. History  Substance Use Topics  . Smoking status: Current Every Day Smoker -- 0.50 packs/day  . Smokeless tobacco: Not on file  . Alcohol Use: No   OB History   Grav Para Term Preterm Abortions TAB SAB Ect Mult Living   4 3 3  0 0 0 0 0 0 3     Review of Systems  Constitutional: Negative for fever  and chills.  HENT: Positive for dental problem and ear pain. Negative for facial swelling and rhinorrhea.   Respiratory: Negative for cough, chest tightness and shortness of breath.   Cardiovascular: Negative for chest pain, palpitations and leg swelling.  Gastrointestinal: Positive for nausea and vomiting. Negative for abdominal pain and diarrhea.  Genitourinary: Negative for dysuria and flank pain.  Musculoskeletal: Negative for arthralgias, myalgias, neck pain and neck stiffness.  Skin: Negative for rash.  Neurological: Positive for headaches. Negative for dizziness, weakness and numbness.  All other systems reviewed and are negative.    Allergies  Review of patient's allergies indicates no known allergies.  Home Medications   Current Outpatient Rx  Name  Route  Sig  Dispense  Refill  . prenatal vitamin w/FE, FA (PRENATAL 1 + 1) 27-1 MG TABS   Oral   Take 1 tablet by mouth daily.            BP 113/73  Pulse 83  Temp(Src) 98.9 F (37.2 C) (Oral)  Resp 15  Wt 147 lb (66.679 kg)  SpO2 100%  LMP 08/04/2013 Physical Exam  Nursing note and vitals reviewed. Constitutional: She is oriented to person, place, and time. She appears well-developed and well-nourished. No distress.  HENT:  Head: Normocephalic.  Right Ear: External ear normal.  Left Ear: External ear normal.  Nose: Nose normal.  TMs normal bilaterally. Right upper 2nd bicuspid cavity, no surrounding gum swelling or obvious  abscess. Tender to palpation over the tooth and gum. No facial swelling. No swelling under the tongue. No trismus.   Eyes: Conjunctivae are normal.  Neck: Neck supple.  Cardiovascular: Normal rate, regular rhythm and normal heart sounds.   Pulmonary/Chest: Effort normal and breath sounds normal. No respiratory distress. She has no wheezes. She has no rales.  Abdominal: Soft. Bowel sounds are normal. She exhibits no distension. There is no tenderness. There is no rebound.  Musculoskeletal: She  exhibits no edema.  Neurological: She is alert and oriented to person, place, and time.  Skin: Skin is warm and dry.  Psychiatric: She has a normal mood and affect. Her behavior is normal.    ED Course  Procedures (including critical care time) Labs Review Labs Reviewed  POCT I-STAT, CHEM 8 - Abnormal; Notable for the following:    Glucose, Bld 100 (*)    All other components within normal limits  CBC WITH DIFFERENTIAL   Imaging Review No results found.  EKG Interpretation   None       MDM   1. Pain, dental   2. Headache   3. Hematemesis   4. Nausea     Patient is here in emergency department with a complaint of a right toothache, that is making her right face hurt as well as giving her a headache. Patient is very tearful in emergency department. She's holding onto her head and screaming in pain. Her exam does show a dental cavity but no obvious abscess. There is no facial swelling. Her tooth is tender to the touch. Treated her pain emergency department with Toradol IM and Percocet which helped her pain significantly. She also complained of several episodes of emesis with streaks of blood. Her H&H are normal. There is no more vomiting today, there is no signs of active hemorrhage. There is no abdominal pain or tenderness. I will start her on protonic. Zofran for nausea and vomiting. Although I do not think that her nausea and vomiting is related to her dental pain, I have instructed her to followup closely with primary care doctor and a dentist.  Filed Vitals:   08/25/13 0839 08/25/13 0930  BP: 141/86 113/73  Pulse: 90 83  Temp: 98.9 F (37.2 C)   TempSrc: Oral   Resp: 15   Weight: 147 lb (66.679 kg)   SpO2: 98% 100%      Aleaya Latona A Shalon Councilman, PA-C 08/25/13 1058
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
## 2013-08-25 NOTE — ED Notes (Signed)
Pt reports initially having pain to right mouth, has been spitting up blood. Now having blood in emesis this am and severe right side facial/head pain, right ear pain.
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
## 2013-08-26 ENCOUNTER — Inpatient Hospital Stay (HOSPITAL_COMMUNITY)
Admission: AD | Admit: 2013-08-26 | Discharge: 2013-08-28 | DRG: 885 | Disposition: A | Discharge status: Home / Self Care | Payer: Federal, State, Local not specified - Other | Source: Intra-hospital | Attending: Emergency Medicine | Admitting: Emergency Medicine

## 2013-08-26 ENCOUNTER — Encounter (HOSPITAL_COMMUNITY): Payer: Self-pay

## 2013-08-26 DIAGNOSIS — K219 Gastro-esophageal reflux disease without esophagitis: Secondary | ICD-10-CM | POA: Diagnosis present

## 2013-08-26 DIAGNOSIS — Z302 Encounter for sterilization: Secondary | ICD-10-CM

## 2013-08-26 DIAGNOSIS — F329 Major depressive disorder, single episode, unspecified: Principal | ICD-10-CM | POA: Diagnosis present

## 2013-08-26 DIAGNOSIS — R45851 Suicidal ideations: Secondary | ICD-10-CM

## 2013-08-26 DIAGNOSIS — Z79899 Other long term (current) drug therapy: Secondary | ICD-10-CM

## 2013-08-26 DIAGNOSIS — K047 Periapical abscess without sinus: Secondary | ICD-10-CM | POA: Diagnosis present

## 2013-08-26 DIAGNOSIS — F3289 Other specified depressive episodes: Secondary | ICD-10-CM

## 2013-08-26 DIAGNOSIS — J329 Chronic sinusitis, unspecified: Secondary | ICD-10-CM

## 2013-08-26 DIAGNOSIS — K029 Dental caries, unspecified: Secondary | ICD-10-CM

## 2013-08-26 DIAGNOSIS — F4310 Post-traumatic stress disorder, unspecified: Secondary | ICD-10-CM | POA: Diagnosis present

## 2013-08-26 DIAGNOSIS — L03211 Cellulitis of face: Secondary | ICD-10-CM

## 2013-08-26 HISTORY — DX: Other specified health status: Z78.9

## 2013-08-26 LAB — COMPREHENSIVE METABOLIC PANEL
ALT: 50 U/L — ABNORMAL HIGH (ref 0–35)
AST: 31 U/L (ref 0–37)
Alkaline Phosphatase: 93 U/L (ref 39–117)
CO2: 20 mEq/L (ref 19–32)
Chloride: 101 mEq/L (ref 96–112)
GFR calc non Af Amer: >90 mL/min (ref >90)
Potassium: 3.7 mEq/L (ref 3.5–5.1)
Sodium: 138 mEq/L (ref 135–145)
Total Bilirubin: <0.1 mg/dL — ABNORMAL LOW (ref 0.3–1.2)

## 2013-08-26 LAB — RAPID URINE DRUG SCREEN, HOSP PERFORMED
Amphetamines: NOT DETECTED
Barbiturates: NOT DETECTED
Opiates: NOT DETECTED
Tetrahydrocannabinol: POSITIVE — AB

## 2013-08-26 LAB — POCT PREGNANCY, URINE: Preg Test, Ur: NEGATIVE

## 2013-08-26 MED ORDER — ONDANSETRON HCL 4 MG PO TABS: 8.0000 mg | ORAL_TABLET | Freq: Three times a day (TID) | ORAL | Status: DC | PRN

## 2013-08-26 MED ORDER — OXYCODONE-ACETAMINOPHEN 5-325 MG PO TABS
2.0000 | ORAL_TABLET | Freq: Once | ORAL | Status: AC
  Administered 2013-08-26: 2 via ORAL
  Filled 2013-08-26 (×2): qty 2

## 2013-08-26 MED ORDER — ALUM & MAG HYDROXIDE-SIMETH 200-200-20 MG/5ML PO SUSP: 30.0000 mL | ORAL | Status: DC | PRN

## 2013-08-26 MED ORDER — PENICILLIN V POTASSIUM 500 MG PO TABS
500.0000 mg | ORAL_TABLET | Freq: Four times a day (QID) | ORAL | Status: DC
  Administered 2013-08-26 (×2): 500 mg via ORAL
  Filled 2013-08-26: qty 1

## 2013-08-26 MED ORDER — OXYCODONE-ACETAMINOPHEN 5-325 MG PO TABS
1.0000 | ORAL_TABLET | ORAL | Status: DC | PRN
  Administered 2013-08-27 – 2013-08-28 (×6): 1 via ORAL
  Filled 2013-08-26 (×6): qty 1

## 2013-08-26 MED ORDER — NICOTINE 21 MG/24HR TD PT24: 21.0000 mg | MEDICATED_PATCH | Freq: Every day | TRANSDERMAL | Status: DC

## 2013-08-26 MED ORDER — OXYCODONE-ACETAMINOPHEN 5-325 MG PO TABS
1.0000 | ORAL_TABLET | Freq: Once | ORAL | Status: AC
  Administered 2013-08-26: 1 via ORAL
  Filled 2013-08-26: qty 1

## 2013-08-26 MED ORDER — KETOROLAC TROMETHAMINE 30 MG/ML IJ SOLN
30.0000 mg | Freq: Once | INTRAMUSCULAR | Status: AC
  Administered 2013-08-26: 30 mg via INTRAMUSCULAR
  Filled 2013-08-26: qty 1

## 2013-08-26 MED ORDER — IBUPROFEN 200 MG PO TABS
600.0000 mg | ORAL_TABLET | Freq: Three times a day (TID) | ORAL | Status: DC | PRN
  Administered 2013-08-26 (×2): 600 mg via ORAL
  Filled 2013-08-26 (×2): qty 3

## 2013-08-26 MED ORDER — ZOLPIDEM TARTRATE 5 MG PO TABS: 5.0000 mg | ORAL_TABLET | Freq: Every evening | ORAL | Status: DC | PRN

## 2013-08-26 MED ORDER — OXYCODONE-ACETAMINOPHEN 5-325 MG PO TABS
1.0000 | ORAL_TABLET | Freq: Four times a day (QID) | ORAL | Status: DC | PRN
  Administered 2013-08-26: 1 via ORAL
  Filled 2013-08-26: qty 1

## 2013-08-26 MED ORDER — BENZOCAINE 10 % MT GEL
Freq: Three times a day (TID) | OROMUCOSAL | Status: DC | PRN
  Filled 2013-08-26: qty 9.4

## 2013-08-26 MED ORDER — BUPIVACAINE-EPINEPHRINE PF 0.25-1:200000 % IJ SOLN
10.0000 mL | Freq: Once | INTRAMUSCULAR | Status: DC
  Filled 2013-08-26: qty 30

## 2013-08-26 MED ORDER — ACETAMINOPHEN 325 MG PO TABS: 650.0000 mg | ORAL_TABLET | ORAL | Status: DC | PRN

## 2013-08-26 MED ORDER — ACETAMINOPHEN 325 MG PO TABS: 650.0000 mg | ORAL_TABLET | Freq: Four times a day (QID) | ORAL | Status: DC | PRN

## 2013-08-26 MED ORDER — AMOXICILLIN 500 MG PO CAPS
500.0000 mg | ORAL_CAPSULE | Freq: Three times a day (TID) | ORAL | Status: DC
  Administered 2013-08-27 (×3): 500 mg via ORAL
  Filled 2013-08-26: qty 1
  Filled 2013-08-26: qty 2
  Filled 2013-08-26 (×7): qty 1

## 2013-08-26 MED ORDER — FLUOXETINE HCL 10 MG PO CAPS
10.0000 mg | ORAL_CAPSULE | Freq: Every day | ORAL | Status: DC
  Administered 2013-08-26: 10 mg via ORAL
  Filled 2013-08-26: qty 1

## 2013-08-26 MED ORDER — ONDANSETRON HCL 4 MG PO TABS: 4.0000 mg | ORAL_TABLET | Freq: Three times a day (TID) | ORAL | Status: DC | PRN

## 2013-08-26 MED ORDER — PANTOPRAZOLE SODIUM 20 MG PO TBEC
20.0000 mg | DELAYED_RELEASE_TABLET | Freq: Every day | ORAL | Status: DC
  Administered 2013-08-26 – 2013-08-28 (×3): 20 mg via ORAL
  Filled 2013-08-26 (×8): qty 1

## 2013-08-26 MED ORDER — MAGNESIUM HYDROXIDE 400 MG/5ML PO SUSP: 30.0000 mL | Freq: Every day | ORAL | Status: DC | PRN

## 2013-08-26 MED ORDER — TRAZODONE HCL 50 MG PO TABS
50.0000 mg | ORAL_TABLET | Freq: Every evening | ORAL | Status: DC | PRN
  Administered 2013-08-26 – 2013-08-28 (×4): 50 mg via ORAL
  Filled 2013-08-26: qty 28
  Filled 2013-08-26 (×9): qty 1
  Filled 2013-08-26: qty 28
  Filled 2013-08-26 (×4): qty 1

## 2013-08-26 MED ORDER — IBUPROFEN 600 MG PO TABS
600.0000 mg | ORAL_TABLET | Freq: Four times a day (QID) | ORAL | Status: DC | PRN
  Administered 2013-08-26 – 2013-08-28 (×6): 600 mg via ORAL
  Filled 2013-08-26 (×6): qty 1

## 2013-08-26 NOTE — ED Notes (Signed)
Pt is awake and alert, pleasant and cooperative. Patient denies HI, SI AH or VH. Discharge vitals 130/98 HR 98 RR 16 and unlabored. Pt has intpatient treatment scheduled. At [facility] Will continue to monitor for safety. Patient escorted by [facility] transportation without incident. T.Melvyn Neth RN

## 2013-08-26 NOTE — Tx Team (Signed)
Initial Interdisciplinary Treatment Plan  PATIENT STRENGTHS: (choose at least two) Ability for insight Capable of independent living Communication skills General fund of knowledge Religious Affiliation  PATIENT STRESSORS: Financial difficulties Traumatic event   PROBLEM LIST: Problem List/Patient Goals Date to be addressed Date deferred Reason deferred Estimated date of resolution  depression      Poor coping skills      Lack of support                                           DISCHARGE CRITERIA:  Ability to meet basic life and health needs Improved stabilization in mood, thinking, and/or behavior  PRELIMINARY DISCHARGE PLAN: Outpatient therapy Return to previous living arrangement  PATIENT/FAMIILY INVOLVEMENT: This treatment plan has been presented to and reviewed with the patient, Kristi Kelley.  The patient and family have been given the opportunity to ask questions and make suggestions.  Heriberto Antigua M 08/26/2013, 7:00 PM

## 2013-08-26 NOTE — ED Provider Notes (Signed)
Pt re-examined because of persistent dental pain.  No swelling or drainage noted. No lymphadenopathy.  Pt will need to see a dentist but at this time. No emergency medical condition.  Continue abx and pain meds.  Celene Kras, MD 08/26/13 [phone]

## 2013-08-26 NOTE — Progress Notes (Signed)
Adult Psychoeducational Group Note  Date:  08/26/2013 Time:  9:57 PM  Group Topic/Focus:  Wrap-Up Group:   The focus of this group is to help patients review their daily goal of treatment and discuss progress on daily workbooks.  Participation Level:  Did Not Attend  Participation Quality:  Did Not Attend  Affect:  Did Not Attend  Cognitive:  Did Not Attend  Insight: None  Engagement in Group:  None  Modes of Intervention:  Discussion, Education and Support  Additional Comments:  Pt did not attend this group because she is having severe tooth pains.  Malachy Moan 08/26/2013, 9:57 PM
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
## 2013-08-26 NOTE — ED Provider Notes (Signed)
Medical screening examination/treatment/procedure(s) were performed by non-physician practitioner and as supervising physician I was immediately available for consultation/collaboration.  EKG Interpretation   None         Jabin Tapp T Rasheida Broden, MD 08/26/13 0803 

## 2013-08-26 NOTE — Consult Note (Signed)
Pt interviewed with NP Josephine. Chart reviewed. Agree with above assessment and plan.

## 2013-08-26 NOTE — ED Notes (Signed)
Pt was evaluated by MD regarding right side tooth pain.  Pt advised to continue with antibiotics and follow with dentists. [place] transportation called for transport.

## 2013-08-26 NOTE — ED Notes (Signed)
Patient presents tearful, calm and cooperative; denies SI,HI,AVH, NAD, c/o severe tooth pain. Patient has been accepted at [facility] pending bed availability.

## 2013-08-27 ENCOUNTER — Emergency Department (HOSPITAL_COMMUNITY)
Admission: EM | Admit: 2013-08-27 | Discharge: 2013-08-28 | Disposition: A | Discharge status: Home / Self Care | Payer: Medicaid Other

## 2013-08-27 ENCOUNTER — Encounter (HOSPITAL_COMMUNITY): Payer: Self-pay | Admitting: Emergency Medicine

## 2013-08-27 ENCOUNTER — Inpatient Hospital Stay (HOSPITAL_COMMUNITY): Payer: Federal, State, Local not specified - Other

## 2013-08-27 DIAGNOSIS — F4310 Post-traumatic stress disorder, unspecified: Secondary | ICD-10-CM

## 2013-08-27 DIAGNOSIS — R45851 Suicidal ideations: Secondary | ICD-10-CM

## 2013-08-27 DIAGNOSIS — F329 Major depressive disorder, single episode, unspecified: Secondary | ICD-10-CM

## 2013-08-27 LAB — POCT I-STAT, CHEM 8
BUN: 5 mg/dL — ABNORMAL LOW (ref 6–23)
Calcium, Ion: 1.22 mmol/L (ref 1.12–1.23)
Chloride: 100 meq/L (ref 96–112)
Creatinine, Ser: 0.5 mg/dL (ref 0.50–1.10)
Glucose, Bld: 97 mg/dL (ref 70–99)
HCT: 46 % (ref 36.0–46.0)
Hemoglobin: 15.6 g/dL — ABNORMAL HIGH (ref 12.0–15.0)
Potassium: 4.1 meq/L (ref 3.7–5.3)
Sodium: 138 meq/L (ref 137–147)
TCO2: 25 mmol/L (ref 0–100)

## 2013-08-27 MED ORDER — CLINDAMYCIN HCL 300 MG PO CAPS: 300.0000 mg | ORAL_CAPSULE | Freq: Four times a day (QID) | ORAL | Status: DC

## 2013-08-27 MED ORDER — LORATADINE 10 MG PO TABS
10.0000 mg | ORAL_TABLET | Freq: Every day | ORAL | Status: DC
  Administered 2013-08-27 – 2013-08-28 (×2): 10 mg via ORAL
  Filled 2013-08-27 (×5): qty 1

## 2013-08-27 MED ORDER — BENZOCAINE 10 % MT GEL
Freq: Four times a day (QID) | OROMUCOSAL | Status: DC | PRN
Start: 2013-08-27 — End: 2013-08-28
  Administered 2013-08-27 – 2013-08-28 (×3): via OROMUCOSAL
  Filled 2013-08-27: qty 9.4

## 2013-08-27 MED ORDER — OXYCODONE-ACETAMINOPHEN 5-325 MG PO TABS: 1.0000 | ORAL_TABLET | ORAL | Status: DC | PRN

## 2013-08-27 MED ORDER — OXYCODONE-ACETAMINOPHEN 5-325 MG PO TABS
2.0000 | ORAL_TABLET | Freq: Once | ORAL | Status: AC
  Administered 2013-08-27: 2 via ORAL
  Filled 2013-08-27: qty 2

## 2013-08-27 MED ORDER — CLINDAMYCIN HCL 300 MG PO CAPS
300.0000 mg | ORAL_CAPSULE | Freq: Three times a day (TID) | ORAL | Status: DC
  Administered 2013-08-27 – 2013-08-28 (×3): 300 mg via ORAL
  Filled 2013-08-27: qty 28
  Filled 2013-08-27 (×2): qty 1
  Filled 2013-08-27: qty 28
  Filled 2013-08-27: qty 1
  Filled 2013-08-27 (×2): qty 28
  Filled 2013-08-27 (×5): qty 1
  Filled 2013-08-27: qty 2
  Filled 2013-08-27: qty 1
  Filled 2013-08-27: qty 2
  Filled 2013-08-27: qty 1

## 2013-08-27 MED ORDER — IOHEXOL 300 MG/ML  SOLN
80.0000 mL | Freq: Once | INTRAMUSCULAR | Status: AC | PRN
  Administered 2013-08-27: 80 mL via INTRAVENOUS

## 2013-08-27 NOTE — Progress Notes (Signed)
Adult Psychoeducational Group Note  Date:  08/27/2013 Time:  11:00am Group Topic/Focus:  Recovery Goals:   The focus of this group is to identify appropriate goals for recovery and establish a plan to achieve them.  Participation Level:  Did Not Attend  Participation Quality:    Affect:    Cognitive:    Insight:  Engagement in Group:    Modes of Intervention:   Additional Comments:  Pt did not attend group.  Shelly Bombard D 08/27/2013, 1:27 PM

## 2013-08-27 NOTE — Progress Notes (Signed)
Adult Psychoeducational Group Note  Date:  08/27/2013 Time:  9:35 PM  Group Topic/Focus:  Wrap-Up Group:   The focus of this group is to help patients review their daily goal of treatment and discuss progress on daily workbooks.  Participation Level:  Did Not Attend   Gerrit Heck 08/27/2013, 9:35 PM

## 2013-08-27 NOTE — ED Provider Notes (Signed)
  Medical screening examination/treatment/procedure(s) were performed by non-physician practitioner and as supervising physician I was immediately available for consultation/collaboration.  EKG Interpretation    Date/Time:    Ventricular Rate:    PR Interval:    QRS Duration:   QT Interval:    QTC Calculation:   R Axis:     Text Interpretation:                 Gerhard Munch, MD 08/27/13 2122
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
## 2013-08-27 NOTE — ED Notes (Addendum)
Pt states that she started having dental pain on her top R side x 3 days ago. Pt in [facility].

## 2013-08-27 NOTE — Progress Notes (Addendum)
The focus of this group is to educate the patient on the purpose and policies of crisis stabilization and provide a format to answer questions about their admission.  The group details unit policies and expectations of patients while admitted.  Patient did not attend 0900 nurse education orientation group this morning, patient stayed in bed. 

## 2013-08-27 NOTE — Progress Notes (Cosign Needed)
D) Pt has been blunted, sad, depressed. Pt says she's at a 5/10 on her depression and hopelessness, although she stated that she really wasn't hopeless. Pt main focus today is on her tooth pain. Pt has rated her pain as 10/10. Stating she is unable to focus on tx due to the pain. Pt has not attended all groups, or has left early during several. Pt is denying any s.i. A) Level 3 obs for safety, support and encouragement provided. 1:1 support from staff. conract for safety. meds as ordered. R) Pt has been receptive.

## 2013-08-27 NOTE — ED Provider Notes (Signed)
CSN: [id number]     Arrival date & time 08/27/13  1615 History   First MD Initiated Contact with Patient 08/27/13 1625  This chart was scribed for non-physician practitioner Trixie Dredge, PA-C working with Gerhard Munch, MD by Valera Castle, ED scribe. This patient was seen in room WTR9/WTR9 and the patient's care was started at 5:04 PM.     Chief Complaint  Patient presents with  . Dental Pain    The history is provided by the patient. No language interpreter was used.   HPI Comments: Kristi Kelley is a 37 y.o. female who presents to the Emergency Department complaining of sudden, constant, right sided dental pain, onset 3 days ago. She was seen here a few days ago for the same pain. She reports discharge from the area, stating she has been spitting out blood. She reports eating and drinking exacerbates her dental pain, and has not eaten in the past 2-3 days as a result. She reports trouble swallowing due to the pain. She reports that she has been taking Amoxicillin and Percocet for 3 days now, without relief. She denies fever, facial swelling, and any other associated symptoms.   PCP  - No PCP Per Patient  Past Medical History  Diagnosis Date  . No pertinent past medical history   . Scoliosis   . Medical history non-contributory    Past Surgical History  Procedure Laterality Date  . No past surgeries    . Tubal ligation  06/07/2011    Procedure: POST PARTUM TUBAL LIGATION;  Surgeon: Reva Bores, MD;  Location: [facility];  Service: Gynecology;  Laterality: Bilateral;   History reviewed. No pertinent family history. History  Substance Use Topics  . Smoking status: Current Every Day Smoker -- 0.50 packs/day for 10 years    Types: Cigarettes  . Smokeless tobacco: Never Used  . Alcohol Use: No   OB History   Grav Para Term Preterm Abortions TAB SAB Ect Mult Living   4 3 3  0 0 0 0 0 0 3     Review of Systems  Constitutional: Negative for fever and chills.  HENT: Positive for  dental problem, facial swelling and sore throat. Negative for trouble swallowing and voice change.   Respiratory: Negative for shortness of breath, wheezing and stridor.   Skin: Negative for color change and wound.  Allergic/Immunologic: Negative for immunocompromised state.  Hematological: Does not bruise/bleed easily.    Allergies  Review of patient's allergies indicates no known allergies.  Home Medications   Current Outpatient Rx  Name  Route  Sig  Dispense  Refill  . amoxicillin (AMOXIL) 500 MG capsule   Oral   Take 1 capsule (500 mg total) by mouth 3 (three) times daily.   21 capsule   0   . loratadine (CLARITIN) 10 MG tablet   Oral   Take 10 mg by mouth daily.         . ondansetron (ZOFRAN) 8 MG tablet   Oral   Take 1 tablet (8 mg total) by mouth every 8 (eight) hours as needed for nausea or vomiting.   20 tablet   0   . oxyCODONE-acetaminophen (PERCOCET) 5-325 MG per tablet   Oral   Take 1 tablet by mouth every 4 (four) hours as needed for severe pain.   15 tablet   0   . pantoprazole (PROTONIX) 20 MG tablet   Oral   Take 1 tablet (20 mg total) by mouth daily.  30 tablet   0   . prenatal vitamin w/FE, FA (PRENATAL 1 + 1) 27-1 MG TABS   Oral   Take 1 tablet by mouth daily.            BP 131/92  Pulse 79  Temp(Src) 98 F (36.7 C) (Oral)  Resp 16  Ht 5\' 4"  (1.626 m)  Wt 145 lb (65.772 kg)  BMI 24.88 kg/m2  LMP 08/04/2013  Physical Exam  Nursing note and vitals reviewed. Constitutional: She appears well-developed and well-nourished. No distress.  HENT:  Head: Normocephalic and atraumatic.  Mouth/Throat: Uvula is midline and oropharynx is clear and moist. Mucous membranes are not dry. No uvula swelling. No oropharyngeal exudate, posterior oropharyngeal edema, posterior oropharyngeal erythema or tonsillar abscesses.    Right facial swelling and tenderness.  Tenderness continues down through the mandible and submandibular area.    Neck: Neck  supple.  Pulmonary/Chest: Effort normal.  Neurological: She is alert.  Skin: She is not diaphoretic.    ED Course  Procedures (including critical care time)  COORDINATION OF CARE: 5:07 PM-Discussed treatment plan which includes consulting with MD Jeraldine Loots with pt at bedside and pt agreed to plan.   Labs Review Labs Reviewed - No data to display Imaging Review Ct Maxillofacial W/cm  08/27/2013   CLINICAL DATA:  Right upper dental pain and right facial swelling  EXAM: CT MAXILLOFACIAL WITH CONTRAST  TECHNIQUE: Multidetector CT imaging of the maxillofacial structures was performed with intravenous contrast. Multiplanar CT image reconstructions were also generated. A small metallic BB was placed on the right temple in order to reliably differentiate right from left.  CONTRAST:  80mL OMNIPAQUE IOHEXOL 300 MG/ML  SOLN  COMPARISON:  None.  FINDINGS: There is mild soft tissue swelling over the right nasal bones and extending inferiorly and laterally over the right facial soft tissues a anterior to the maxillary sinus and maxilla on the right. There is no evidence of abscess. There is subtle increased attenuation in the subcutaneous soft tissues with average attenuation value on the left of -73 and on the right and -17.  The frontal sinuses are not developed. There is extensive bilateral ethmoid air cell opacification. Along the floor of both maxillary sinuses there is moderate mucosal periosteal thickening and there are bilateral maxillary sinus air-fluid levels. The sphenoid sinuses are essentially clear with only mild mucosal periosteal thickening anteriorly bilaterally. There is no evidence of periosteal reaction or other focal osseous change to suggest osteomyelitis.  The fifth tooth (right upper) demonstrates a central and periapical lucency which could represent caries.  IMPRESSION: Findings consistent with significant diffuse bilateral sinusitis and right facial cellulitis.Abnormality involving tooth  # 5 concerning for caries.   Electronically Signed   By: Esperanza Heir M.D.   On: 08/27/2013 18:16    EKG Interpretation   None      5:13 PM Pt also seen and examined by Dr Jeraldine Loots. Plan is for pain control, CT maxillofacial, anticipate change to clindamycin.   MDM   1. MDD (major depressive disorder)   2. Facial cellulitis   3. Sinusitis   4. Dental caries    Patient with right dental cavity, right facial swelling concern for dental abscess - found to have right facial cellulitis without abscess, also with sinusitis and cavity.  Discussed treatment plan with Dr Jeraldine Loots.  As patient is inpatient at behavioral health, she can be transferred back to [facility] for continued treatment.  Will change antibiotic to clindamycin and start percocet 1-2  Q4hrs prn pain.  Pt to return for worsening symptoms including fevers, worsening swelling, any difficulty swallowing or breathing.  Discussed all results with patient.  Pt given return precautions.  Pt verbalizes understanding and agrees with plan.      I personally performed the services described in this documentation, which was scribed in my presence. The recorded information has been reviewed and is accurate.    Trixie Dredge, PA-C 08/27/13 1950

## 2013-08-27 NOTE — Progress Notes (Signed)
Pt is in extreme pain 10 out of 10 on right upper teeth, pt crying, tearful, right upper lips and face are swollen and throbbing, pt states that drainage and swelling started this am, pt was seen in ED yesterday and has been receiving PO abx treatment with no signs of improvement, NP/MD notified, ED Charge RN called to notify that pt will be transferred there for evaluation, Adult unit Charge RN notified, [facility] called, awaiting [facility] arival for transport.

## 2013-08-27 NOTE — ED Notes (Signed)
[facility] here to take pt back to [facility].  

## 2013-08-27 NOTE — ED Notes (Signed)
Bed: WTR9 Expected date:  Expected time:  Means of arrival:  Comments: Lanae Boast

## 2013-08-27 NOTE — ED Notes (Signed)
[facility] called to take pt back to [facility].  
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
## 2013-08-27 NOTE — Progress Notes (Signed)
D:  Patient went to ED earlier in the shift.  Returned to the unit at 19:50 with prescription for antibiotics.  She has been unable to eat and states she was feeling a bit light headed.  Took some Gatorade.  Wants to be discharged tomorrow to be able to be with her grandmother who is dying.   A:  Call placed to [place], [place] to change antibiotic.  First dose given.  Also medicated once with ibuprofen.  Found patient some soft food she was able to eat.  Offered support and encouragement.  R:  Cooperative with staff.  Interacting some with peers, but has limited her contact with others due to pain in her mouth.  Looking forward to discharge tomorrow.

## 2013-08-27 NOTE — [facility] Group Notes (Signed)
[facility] LCSW Group Therapy  08/27/2013 2:21 PM  Type of Therapy:  Group Therapy  Participation Level:  Did Not Attend    [name] 08/27/2013, 2:21 PM

## 2013-08-27 NOTE — ED Notes (Signed)
Pt from [facility] c/o dental pain R upper x2 days. Pt is accompanied by [facility] tech

## 2013-08-27 NOTE — [facility] Suicide Risk Assessment (Signed)
Suicide Risk Assessment  Admission Assessment     Nursing information obtained from:    Demographic factors:    Current Mental Status:    Loss Factors:    Historical Factors:    Risk Reduction Factors:     CLINICAL FACTORS:   Depression:   Severe  COGNITIVE FEATURES THAT CONTRIBUTE TO RISK:  Closed-mindedness Polarized thinking Thought constriction (tunnel vision)    SUICIDE RISK:   Moderate:  Frequent suicidal ideation with limited intensity, and duration, some specificity in terms of plans, no associated intent, good self-control, limited dysphoria/symptomatology, some risk factors present, and identifiable protective factors, including available and accessible social support.  PLAN OF CARE: Supportive approach/coping skills                               CBT;minfulness                               Evaluate for psychotropic medications  I certify that inpatient services furnished can reasonably be expected to improve the patient's condition.  Mikeya Tomasetti A 08/27/2013, 4:25 PM

## 2013-08-28 DIAGNOSIS — F329 Major depressive disorder, single episode, unspecified: Principal | ICD-10-CM

## 2013-08-28 MED ORDER — LORATADINE 10 MG PO TABS: 10.0000 mg | ORAL_TABLET | Freq: Every day | ORAL | Status: AC

## 2013-08-28 MED ORDER — PANTOPRAZOLE SODIUM 20 MG PO TBEC: 20.0000 mg | DELAYED_RELEASE_TABLET | Freq: Every day | ORAL | Status: DC

## 2013-08-28 MED ORDER — OXYCODONE-ACETAMINOPHEN 5-325 MG PO TABS: 1.0000 | ORAL_TABLET | ORAL | Status: DC | PRN

## 2013-08-28 MED ORDER — BENZOCAINE 10 % MT GEL: Freq: Four times a day (QID) | OROMUCOSAL | Status: DC | PRN

## 2013-08-28 MED ORDER — CLINDAMYCIN HCL 300 MG PO CAPS: 300.0000 mg | ORAL_CAPSULE | Freq: Four times a day (QID) | ORAL | Status: DC

## 2013-08-28 MED ORDER — TRAZODONE HCL 50 MG PO TABS: 50.0000 mg | ORAL_TABLET | Freq: Every evening | ORAL | Status: DC | PRN

## 2013-08-28 NOTE — [facility] Group Notes (Signed)
[facility] LCSW Group Therapy  08/28/2013  1:15 PM   Type of Therapy:  Group Therapy  Participation Level:  Active  Participation Quality:  Attentive, Sharing and Supportive  Affect:  Depressed and Tearful  Cognitive:  Alert and Oriented  Insight:  Developing/Improving and Engaged  Engagement in Therapy:  Developing/Improving and Engaged  Modes of Intervention:  Clarification, Confrontation, Discussion, Education, Exploration, Limit-setting, Orientation, Problem-solving, Rapport Building, Dance movement psychotherapist, Socialization and Support  Summary of Progress/Problems: The topic for group today was emotional regulation.  This group focused on both positive and negative emotion identification and allowed group members to process ways to identify feelings, regulate negative emotions, and find healthy ways to manage internal/external emotions. Group members were asked to reflect on a time when their reaction to an emotion led to a negative outcome and explored how alternative responses using emotion regulation would have benefited them. Group members were also asked to discuss a time when emotion regulation was utilized when a negative emotion was experienced.   Pt shared that her grandmother passed away today and is sad about this.  Pt states that she has to remain strong for her mother to help her through this grieving time and plans to rely on her faith to get through.  Pt actively participated and was engaged in group discussion.    Kristi Ivan, LCSW 08/28/2013 3:09 PM

## 2013-08-28 NOTE — Progress Notes (Signed)
[facility] Adult Case Management Discharge Plan :  Will you be returning to the same living situation after discharge: Yes,  returning home At discharge, do you have transportation home?:Yes,  family will pick pt up Do you have the ability to pay for your medications:Yes,  access to meds  Release of information consent forms completed and in the chart;  Patient's signature needed at discharge.  Patient to Follow up at: Follow-up Information   Schedule an appointment as soon as possible for a visit with Lankin,CHRISTOPHER L, DDS.   Specialty:  Oral Surgery   Contact information:   [address] [place], [address] [place] [address] [phone]       Follow up with [facility] On 08/30/2013. (Walk in for hospital discharge appointment, walk in clinic is Monday - Friday 8 am - 3 pm.  They will than schedule you for medication management)    Contact information:   [address], [place] [address] Phone: [phone] Fax: [phone]      Patient denies SI/HI:   Yes,  denies SI/HI    Safety Planning and Suicide Prevention discussed:  Yes,  discussed with pt.  Pt refused contact with family/friend at this time.  See suicide prevention education note.   Carmina Miller 08/28/2013, 2:59 PM

## 2013-08-28 NOTE — [facility] Suicide Risk Assessment (Signed)
Suicide Risk Assessment  Discharge Assessment     Demographic Factors:  NA  Mental Status Per Nursing Assessment::   On Admission:     Current Mental Status by Physician: In full contact with reality. Denies suicidal ideas, plans or intent. States that she was only wanting to be placed on medications and given follow up appointments. She just found out that her grandmother died this morning. She wants to be there with the family. States she was never suicidal, just wanted to get outpatient help. States she needs to be well for her children   Loss Factors: Loss of significant relationship  Historical Factors: NA  Risk Reduction Factors:   Responsible for children under 39 years of age, Sense of responsibility to family, Living with another person, especially a relative and Positive social support  Continued Clinical Symptoms:  Depression:   Insomnia  Cognitive Features That Contribute To Risk:  Closed-mindedness Polarized thinking Thought constriction (tunnel vision)    Suicide Risk:  Minimal: No identifiable suicidal ideation.  Patients presenting with no risk factors but with morbid ruminations; may be classified as minimal risk based on the severity of the depressive symptoms  Discharge Diagnoses:   AXIS I:  Major Depression AXIS II:  Deferred AXIS III:   Past Medical History  Diagnosis Date  . No pertinent past medical history   . Scoliosis   . Medical history non-contributory    AXIS IV:  other psychosocial or environmental problems AXIS V:  61-70 mild symptoms  Plan Of Care/Follow-up recommendations:  Activity:  as tolerated Diet:  regular Follow up outpatient basis Hospice for grief counseling Is patient on multiple antipsychotic therapies at discharge:  No   Has Patient had three or more failed trials of antipsychotic monotherapy by history:  No  Recommended Plan for Multiple Antipsychotic Therapies: NA  Kristi Kelley A 08/28/2013, 1:26 PM

## 2013-08-28 NOTE — [facility] Group Notes (Signed)
[facility] LCSW Aftercare Discharge Planning Group Note   08/28/2013  8:45 AM  Participation Quality:  Did Not Attend - pt was sleeping in her room  Reyes Ivan, LCSW 08/28/2013 10:26 AM

## 2013-08-28 NOTE — Progress Notes (Signed)
Patient ID: Kristi Kelley, female   DOB: 1976/06/07, 37 y.o.   MRN: [id number] Patient discharged per physician order; patient denies SI/HI and A/V hallucinations; patient received samples, prescriptions, and copy of AVS after it was reviewed; patient had no other concerns at this time; patient left the unit ambulatory

## 2013-08-28 NOTE — Progress Notes (Signed)
Adult Psychoeducational Group Note  Date:  08/28/2013 Time:  11:00am Group Topic/Focus:  Personal Choices and Values:   The focus of this group is to help patients assess and explore the importance of values in their lives, how their values affect their decisions, how they express their values and what opposes their expression.  Participation Level:  Did Not Attend  Participation Quality:    Affect:    Cognitive:    Insight:   Engagement in Group:   Modes of Intervention:    Additional Comments: Pt did not attend group.  Shelly Bombard D 08/28/2013, 1:25 PM

## 2013-08-28 NOTE — Progress Notes (Signed)
D: Patient denies SI/HI and A/V hallucinations; patient reports that she can only think about her tooth pain too much to think about depression; rates anxiety as 5/10;   A: Monitored q 15 minutes; patient encouraged to attend groups; patient educated about medications; patient given medications per physician orders; patient encouraged to express feelings and/or concerns  R: Patient is very minimal; patient has been in the bed most of the day; patient is appropriate to circumstances;  patient's interaction with staff and peers is appropriate; patient was able to set goal to talk with staff 1:1 when having feelings of SI; patient is taking medications as prescribed and tolerating medications; patient is attending some groups

## 2013-08-28 NOTE — [facility] Suicide Risk Assessment (Signed)
[facility] Adult Inpatient Family/Significant Other Suicide Prevention Education  Suicide Prevention Education:   Patient Refusal for Family/Significant Other Suicide Prevention Education: The patient has refused to provide written consent for family/significant other to be provided Family/Significant Other Suicide Prevention Education during admission and/or prior to discharge.  Physician notified.  CSW provided suicide prevention information with patient.    The suicide prevention education provided includes the following:  Suicide risk factors  Suicide prevention and interventions  National Suicide Hotline telephone number  [facility] assessment telephone number  [facility] Emergency Assistance 911  [facility] and/or Residential Mobile Crisis Unit telephone number   Reyes Ivan, [place] 08/28/2013 1:32 PM

## 2013-08-28 NOTE — Tx Team (Addendum)
Interdisciplinary Treatment Plan Update (Adult)  Date: 08/28/2013  Time Reviewed:  9:45 AM  Progress in Treatment: Attending groups: Yes Participating in groups:  Yes Taking medication as prescribed:  Yes Tolerating medication:  Yes Family/Significant othe contact made: No, pt refused Patient understands diagnosis:  Yes Discussing patient identified problems/goals with staff:  Yes Medical problems stabilized or resolved:  Yes Denies suicidal/homicidal ideation: Yes Issues/concerns per patient self-inventory:  Yes Other:  New problem(s) identified: Pt's grandmother passed away today and would like to be home with family at this time.  Pt denies SI/HI and is stable to d/c.    Discharge Plan or Barriers: Pt will follow up at [facility] for medication management and therapy.    Reason for Continuation of Hospitalization: Stable to d/c  Comments: N/A  Estimated length of stay: D/C today  For review of initial/current patient goals, please see plan of care.  Attendees: Patient:    Family:     Physician:    Nursing:      Clinical Social Worker:  Reyes Ivan, LCSW 08/28/2013 10:32 AM   Other: Nanine Means, NP 08/28/2013 10:32 AM   Other:  Elizbeth Squires, care coordination 08/28/2013 10:32 AM   Other:  Juline Patch, LCSW 08/28/2013 10:32 AM   Other:  Lowella Grip, RN 08/28/2013 10:33 AM   Other:  Burnetta Sabin, RN 08/28/2013 10:33 AM   Other:  Valentina Shaggy, RN 08/28/2013 10:33 AM   Other: Claudette Head, NP 08/28/2013 10:34 AM   Other:    Other:      Scribe for Treatment Team:   Carmina Miller, 08/28/2013 , 10:32 AM
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
## 2013-09-02 NOTE — Progress Notes (Signed)
Patient Discharge Instructions:  After Visit Summary (AVS):   Faxed to:  09/02/13 Discharge Summary Note:   Faxed to:  09/02/13 Psychiatric Admission Assessment Note:   Faxed to:  09/02/13 Suicide Risk Assessment - Discharge Assessment:   Faxed to:  09/02/13 Faxed/Sent to the Next Level Care provider:  09/02/13 [facility] @ [phone]  Jerelene ReddenSheena E [place], 09/02/2013, 3:56 PM

## 2014-06-30 ENCOUNTER — Encounter (HOSPITAL_COMMUNITY): Payer: Self-pay | Admitting: Emergency Medicine

## 2014-10-26 ENCOUNTER — Emergency Department (HOSPITAL_COMMUNITY)
Admission: EM | Admit: 2014-10-26 | Discharge: 2014-10-26 | Disposition: A | Discharge status: Home / Self Care | Payer: Medicaid Other | Attending: Emergency Medicine | Admitting: Emergency Medicine

## 2014-10-26 ENCOUNTER — Encounter (HOSPITAL_COMMUNITY): Payer: Self-pay | Admitting: *Deleted

## 2014-10-26 DIAGNOSIS — Z79899 Other long term (current) drug therapy: Secondary | ICD-10-CM | POA: Insufficient documentation

## 2014-10-26 DIAGNOSIS — K088 Other specified disorders of teeth and supporting structures: Secondary | ICD-10-CM | POA: Insufficient documentation

## 2014-10-26 DIAGNOSIS — K0889 Other specified disorders of teeth and supporting structures: Secondary | ICD-10-CM

## 2014-10-26 DIAGNOSIS — Z792 Long term (current) use of antibiotics: Secondary | ICD-10-CM | POA: Diagnosis not present

## 2014-10-26 DIAGNOSIS — M419 Scoliosis, unspecified: Secondary | ICD-10-CM | POA: Insufficient documentation

## 2014-10-26 DIAGNOSIS — Z720 Tobacco use: Secondary | ICD-10-CM | POA: Insufficient documentation

## 2014-10-26 MED ORDER — BUPIVACAINE-EPINEPHRINE (PF) 0.5% -1:200000 IJ SOLN
1.8000 mL | Freq: Once | INTRAMUSCULAR | Status: AC
  Administered 2014-10-26: 1.8 mL
  Filled 2014-10-26: qty 1.8

## 2014-10-26 MED ORDER — PENICILLIN V POTASSIUM 500 MG PO TABS: 500.0000 mg | ORAL_TABLET | Freq: Three times a day (TID) | ORAL | Status: DC

## 2014-10-26 MED ORDER — IBUPROFEN 800 MG PO TABS: 800.0000 mg | ORAL_TABLET | Freq: Three times a day (TID) | ORAL | Status: DC

## 2014-10-26 NOTE — ED Provider Notes (Signed)
CSN: [id number]     Arrival date & time 10/26/14  1955 History  This chart was scribed for non-physician practitioner, Fayrene HelperBowie Javaya [place], PA-C,working with Vida RollerBrian D Miller, MD, by Karle PlumberJennifer Tensley, ED Scribe. This patient was seen in room TR08C/TR08C and the patient's care was started at 8:40 PM.  Chief Complaint  Patient presents with  . Dental Pain   Patient is a 39 y.o. female presenting with tooth pain. The history is provided by the patient. No language interpreter was used.  Dental Pain Associated symptoms: no fever     HPI Comments:  Kristi Kelley is a 39 y.o. female who presents to the Emergency Department complaining of severe sharp upper right sided dental pain that began three days ago. She states she has used Orajel and OTC pain medication with no relief of the pain. Breathing in makes the pain worse. Denies alleviating factors. Pt states she has found a dentist in the area but has not yet made an appt. Reports a fever Tmax 103 degrees yesterday. Denies current fever, chills or inability to swallow. PMHx of scoliosis.  Past Medical History  Diagnosis Date  . No pertinent past medical history   . Scoliosis   . Medical history non-contributory    Past Surgical History  Procedure Laterality Date  . No past surgeries    . Tubal ligation  06/07/2011    Procedure: POST PARTUM TUBAL LIGATION;  Surgeon: Reva Boresanya S Pratt, MD;  Location: [facility];  Service: Gynecology;  Laterality: Bilateral;   No family history on file. History  Substance Use Topics  . Smoking status: Current Every Day Smoker -- 0.50 packs/day for 10 years    Types: Cigarettes  . Smokeless tobacco: Never Used  . Alcohol Use: No   OB History    Gravida Para Term Preterm AB TAB SAB Ectopic Multiple Living   4 3 3  0 0 0 0 0 0 3     Review of Systems  Constitutional: Negative for fever and chills.  HENT: Positive for dental problem. Negative for trouble swallowing.     Allergies  Review of patient's allergies  indicates no known allergies.  Home Medications   Prior to Admission medications   Medication Sig Start Date End Date Taking? Authorizing Provider  benzocaine (ORAJEL) 10 % mucosal gel Use as directed in the mouth or throat 4 (four) times daily as needed for mouth pain. 08/28/13   Nanine MeansJamison Lord, NP  clindamycin (CLEOCIN) 300 MG capsule Take 1 capsule (300 mg total) by mouth 4 (four) times daily. 08/28/13   Nanine MeansJamison Lord, NP  loratadine (CLARITIN) 10 MG tablet Take 1 tablet (10 mg total) by mouth daily. 08/28/13   Nanine MeansJamison Lord, NP  oxyCODONE-acetaminophen (PERCOCET/ROXICET) 5-325 MG per tablet Take 1-2 tablets by mouth every 4 (four) hours as needed for severe pain. 08/28/13   Nanine MeansJamison Lord, NP  pantoprazole (PROTONIX) 20 MG tablet Take 1 tablet (20 mg total) by mouth daily. 08/28/13   Nanine MeansJamison Lord, NP  prenatal vitamin w/FE, FA (PRENATAL 1 + 1) 27-1 MG TABS tablet Take 1 tablet by mouth daily. 08/28/13   Nanine MeansJamison Lord, NP  traZODone (DESYREL) 50 MG tablet Take 1 tablet (50 mg total) by mouth at bedtime and may repeat dose one time if needed. 08/28/13   Nanine MeansJamison Lord, NP   Triage Vitals: BP 130/89 mmHg  Pulse 94  Temp(Src) 98.4 F (36.9 C)  Resp 18  Ht 5\' 2"  (1.575 m)  Wt 145 lb (65.772 kg)  BMI  26.51 kg/m2  SpO2 94%  LMP 10/24/2014 Physical Exam  Constitutional: She is oriented to person, place, and time. She appears well-developed and well-nourished.  HENT:  Head: Normocephalic and atraumatic.  Mouth/Throat: No trismus in the jaw.  Tenderness to tooth number 5 without any obvious gingival erythema or abscess. No evidence of deep tissue infection.  Eyes: EOM are normal.  Neck: Normal range of motion.  Cardiovascular: Normal rate.   Pulmonary/Chest: Effort normal.  Musculoskeletal: Normal range of motion.  Neurological: She is alert and oriented to person, place, and time.  Skin: Skin is warm and dry.  Psychiatric: She has a normal mood and affect. Her behavior is normal.  Nursing  note and vitals reviewed.   ED Course  NERVE BLOCK Date/Time: 10/26/2014 10:08 PM Performed by: Fayrene Helper Authorized by: Fayrene Helper Consent: Verbal consent obtained. Risks and benefits: risks, benefits and alternatives were discussed Consent given by: patient Patient understanding: patient states understanding of the procedure being performed Patient consent: the patient's understanding of the procedure matches consent given Patient identity confirmed: verbally with patient and arm band Time out: Immediately prior to procedure a "time out" was called to verify the correct patient, procedure, equipment, support staff and site/side marked as required. Indications: pain relief Body area: face/mouth Nerve: infraorbital Laterality: right Preparation: Patient was prepped and draped in the usual sterile fashion. Patient position: sitting Needle gauge: 27 G Local anesthetic: bupivacaine 0.5% with epinephrine Anesthetic total: 1.8 ml Outcome: pain improved   (including critical care time) DIAGNOSTIC STUDIES: Oxygen Saturation is 94% on RA, adequate by my interpretation.   COORDINATION OF CARE: 8:43 PM- Will administer a dental block. Pt verbalizes understanding and agrees to plan.    Medications - No data to display  Labs Review Labs Reviewed - No data to display  Imaging Review No results found.   EKG Interpretation None      MDM   Final diagnoses:  Pain, dental   BP 130/89 mmHg  Pulse 94  Temp(Src) 98.4 F (36.9 C)  Resp 18  Ht  (1.575 m)  Wt 145 lb (65.772 kg)  BMI 26.51 kg/m2  SpO2 94%  LMP 10/24/2014   I personally performed the services described in this documentation, which was scribed in my presence. The recorded information has been reviewed and is accurate.    Fayrene Helper, PA-C 10/26/14 2209  Vida Roller, MD 10/27/14 [phone]

## 2014-10-26 NOTE — Discharge Instructions (Signed)

## 2014-10-26 NOTE — ED Notes (Signed)
The pt is c/o a toothache  For 2 days.  Tearful at triage

## 2016-07-26 ENCOUNTER — Encounter (HOSPITAL_COMMUNITY): Payer: Self-pay | Admitting: Emergency Medicine

## 2016-07-26 ENCOUNTER — Ambulatory Visit (HOSPITAL_COMMUNITY)
Admission: EM | Admit: 2016-07-26 | Discharge: 2016-07-26 | Disposition: A | Discharge status: Home / Self Care | Payer: Medicaid Other | Attending: Emergency Medicine | Admitting: Emergency Medicine

## 2016-07-26 DIAGNOSIS — N72 Inflammatory disease of cervix uteri: Secondary | ICD-10-CM

## 2016-07-26 DIAGNOSIS — R3915 Urgency of urination: Secondary | ICD-10-CM | POA: Diagnosis not present

## 2016-07-26 DIAGNOSIS — Z9889 Other specified postprocedural states: Secondary | ICD-10-CM | POA: Diagnosis not present

## 2016-07-26 DIAGNOSIS — M419 Scoliosis, unspecified: Secondary | ICD-10-CM | POA: Insufficient documentation

## 2016-07-26 DIAGNOSIS — Z79899 Other long term (current) drug therapy: Secondary | ICD-10-CM | POA: Diagnosis not present

## 2016-07-26 DIAGNOSIS — F17210 Nicotine dependence, cigarettes, uncomplicated: Secondary | ICD-10-CM | POA: Diagnosis not present

## 2016-07-26 DIAGNOSIS — R102 Pelvic and perineal pain: Secondary | ICD-10-CM | POA: Diagnosis not present

## 2016-07-26 DIAGNOSIS — N739 Female pelvic inflammatory disease, unspecified: Secondary | ICD-10-CM | POA: Diagnosis not present

## 2016-07-26 DIAGNOSIS — A6009 Herpesviral infection of other urogenital tract: Secondary | ICD-10-CM

## 2016-07-26 DIAGNOSIS — A6000 Herpesviral infection of urogenital system, unspecified: Secondary | ICD-10-CM | POA: Insufficient documentation

## 2016-07-26 DIAGNOSIS — Z888 Allergy status to other drugs, medicaments and biological substances status: Secondary | ICD-10-CM | POA: Diagnosis not present

## 2016-07-26 DIAGNOSIS — N730 Acute parametritis and pelvic cellulitis: Secondary | ICD-10-CM | POA: Diagnosis not present

## 2016-07-26 DIAGNOSIS — N760 Acute vaginitis: Secondary | ICD-10-CM | POA: Diagnosis not present

## 2016-07-26 DIAGNOSIS — R109 Unspecified abdominal pain: Secondary | ICD-10-CM | POA: Diagnosis not present

## 2016-07-26 LAB — POCT URINALYSIS DIP (DEVICE)
GLUCOSE, UA: NEGATIVE mg/dL
KETONES UR: 15 mg/dL — AB
Nitrite: NEGATIVE
PROTEIN: NEGATIVE mg/dL
Specific Gravity, Urine: 1.02 (ref 1.005–1.030)
Urobilinogen, UA: 0.2 mg/dL (ref 0.0–1.0)
pH: 6 (ref 5.0–8.0)

## 2016-07-26 LAB — POCT PREGNANCY, URINE: PREG TEST UR: NEGATIVE

## 2016-07-26 MED ORDER — CEFTRIAXONE SODIUM 250 MG IJ SOLR
INTRAMUSCULAR | Status: AC
  Filled 2016-07-26: qty 250

## 2016-07-26 MED ORDER — CEFTRIAXONE SODIUM 250 MG IJ SOLR
250.0000 mg | Freq: Once | INTRAMUSCULAR | Status: AC
  Administered 2016-07-26: 250 mg via INTRAMUSCULAR

## 2016-07-26 MED ORDER — LIDOCAINE HCL (PF) 1 % IJ SOLN
INTRAMUSCULAR | Status: AC
  Filled 2016-07-26: qty 2

## 2016-07-26 MED ORDER — VALACYCLOVIR HCL 1 G PO TABS: 1000.0000 mg | ORAL_TABLET | Freq: Two times a day (BID) | ORAL | 0 refills | Status: DC

## 2016-07-26 MED ORDER — METRONIDAZOLE 500 MG PO TABS: 500.0000 mg | ORAL_TABLET | Freq: Two times a day (BID) | ORAL | 0 refills | Status: DC

## 2016-07-26 MED ORDER — AZITHROMYCIN 250 MG PO TABS
1000.0000 mg | ORAL_TABLET | Freq: Once | ORAL | Status: AC
  Administered 2016-07-26: 1000 mg via ORAL

## 2016-07-26 MED ORDER — AZITHROMYCIN 250 MG PO TABS
ORAL_TABLET | ORAL | Status: AC
  Filled 2016-07-26: qty 4

## 2016-07-26 NOTE — Discharge Instructions (Signed)
Take the medication as directed. Read your instructions accompanying your discharge papers. Your test results should be back in approximately 24-36 hours.
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
## 2016-07-26 NOTE — ED Provider Notes (Signed)
CSN: [id number]     Arrival date & time 07/26/16  1711 History   First MD Initiated Contact with Patient 07/26/16 1741     Chief Complaint  Patient presents with  . Abdominal Pain   (Consider location/radiation/quality/duration/timing/severity/associated sxs/prior Treatment) 40 year old female complaining of pain across the lower pelvis associated with cramping. She is also complaining of painful blisters to the right proximal medial thigh. Most the symptoms started approximate 5 days ago. She states she had a course on Thanksgiving day which was about the time that the symptoms occurred. Denies any known discharge and that she is just completing her menstrual flow and is unsure whether it is a mixture of discharge with menstrual flow or not. She also has an occasional urinary urgency and low volume voids.      Past Medical History:  Diagnosis Date  . Medical history non-contributory   . No pertinent past medical history   . Scoliosis    Past Surgical History:  Procedure Laterality Date  . NO PAST SURGERIES    . TUBAL LIGATION  06/07/2011   Procedure: POST PARTUM TUBAL LIGATION;  Surgeon: Reva Boresanya S Pratt, MD;  Location: [facility];  Service: Gynecology;  Laterality: Bilateral;   History reviewed. No pertinent family history. Social History  Substance Use Topics  . Smoking status: Current Every Day Smoker    Packs/day: 0.50    Years: 10.00    Types: Cigarettes  . Smokeless tobacco: Never Used  . Alcohol use No   OB History    Gravida Para Term Preterm AB Living   4 3 3  0 0 3   SAB TAB Ectopic Multiple Live Births   0 0 0 0 1     Review of Systems  Constitutional: Negative.  Negative for fever.  HENT: Negative.   Respiratory: Negative.   Gastrointestinal: Negative.   Genitourinary: Positive for pelvic pain and vaginal pain. Negative for menstrual problem.  Musculoskeletal: Negative.   Neurological: Negative.   Psychiatric/Behavioral: Negative.   All other systems reviewed  and are negative.   Allergies  Tylenol [acetaminophen]  Home Medications   Prior to Admission medications   Medication Sig Start Date End Date Taking? Authorizing Provider  benzocaine (ORAJEL) 10 % mucosal gel Use as directed in the mouth or throat 4 (four) times daily as needed for mouth pain. 08/28/13   Charm RingsJamison Y Lord, NP  ibuprofen (ADVIL,MOTRIN) 800 MG tablet Take 1 tablet (800 mg total) by mouth 3 (three) times daily. 10/26/14   Fayrene HelperBowie Tran, PA-C  loratadine (CLARITIN) 10 MG tablet Take 1 tablet (10 mg total) by mouth daily. 08/28/13   Charm RingsJamison Y Lord, NP  metroNIDAZOLE (FLAGYL) 500 MG tablet Take 1 tablet (500 mg total) by mouth 2 (two) times daily. X 7 days 07/26/16   Hayden Rasmussenavid Theone Bowell, NP  oxyCODONE-acetaminophen (PERCOCET/ROXICET) 5-325 MG per tablet Take 1-2 tablets by mouth every 4 (four) hours as needed for severe pain. 08/28/13   Charm RingsJamison Y Lord, NP  pantoprazole (PROTONIX) 20 MG tablet Take 1 tablet (20 mg total) by mouth daily. 08/28/13   Charm RingsJamison Y Lord, NP  penicillin v potassium (VEETID) 500 MG tablet Take 1 tablet (500 mg total) by mouth 3 (three) times daily. 10/26/14   Fayrene HelperBowie Tran, PA-C  prenatal vitamin w/FE, FA (PRENATAL 1 + 1) 27-1 MG TABS tablet Take 1 tablet by mouth daily. 08/28/13   Charm RingsJamison Y Lord, NP  traZODone (DESYREL) 50 MG tablet Take 1 tablet (50 mg total) by mouth at bedtime  and may repeat dose one time if needed. 08/28/13   Charm Rings, NP  valACYclovir (VALTREX) 1000 MG tablet Take 1 tablet (1,000 mg total) by mouth 2 (two) times daily. 07/26/16   Hayden Rasmussen, NP   Meds Ordered and Administered this Visit   Medications  cefTRIAXone (ROCEPHIN) injection 250 mg (not administered)  azithromycin (ZITHROMAX) tablet 1,000 mg (not administered)    BP 127/92 (BP Location: Left Arm)   Pulse 79   Temp 98.4 F (36.9 C) (Oral)   Resp 18   LMP 07/18/2016 (Exact Date)   SpO2 100%  No data found.   Physical Exam  Constitutional: She is oriented to person, place, and  time. She appears well-developed and well-nourished. No distress.  HENT:  Head: Normocephalic and atraumatic.  Neck: Neck supple.  Cardiovascular: Normal rate.   Pulmonary/Chest: Effort normal.  Abdominal: Soft.  Anterior pelvic tenderness.  Genitourinary:  Genitourinary Comments: Kara, x-ray tech present during pelvic exam. Normal external female genitalia. The right inner proximal thigh with a cervical of raised flesh to slightly lighter colored papular vesicular lesions extremely tender. Patient does allow palpation due to the tenderness pain. No drainage. Cervix is covered with a discolored mucopurulent material and scant amount of bleeding. Vaginal walls covered with medium thick creamy yellow to white discharge. There was ectocervical tenderness associated with swabs collection. Positive for moderate to severe CMT and right adnexal tenderness.  Musculoskeletal: Normal range of motion. She exhibits no edema.  Neurological: She is alert and oriented to person, place, and time.  Skin: Skin is warm and dry. Capillary refill takes less than 2 seconds.  Psychiatric: She has a normal mood and affect.  Nursing note and vitals reviewed.   Urgent Care Course   Clinical Course     Procedures (including critical care time)  Labs Review Labs Reviewed  POCT URINALYSIS DIP (DEVICE) - Abnormal; Notable for the following:       Result Value   Bilirubin Urine SMALL (*)    Ketones, ur 15 (*)    Hgb urine dipstick TRACE (*)    Leukocytes, UA SMALL (*)    All other components within normal limits  URINE CULTURE  HSV CULTURE AND TYPING  POCT PREGNANCY, URINE  CERVICOVAGINAL ANCILLARY ONLY   Results for orders placed or performed during the hospital encounter of 07/26/16  POCT urinalysis dip (device)  Result Value Ref Range   Glucose, UA NEGATIVE NEGATIVE mg/dL   Bilirubin Urine SMALL (A) NEGATIVE   Ketones, ur 15 (A) NEGATIVE mg/dL   Specific Gravity, Urine 1.020 1.005 - 1.030   Hgb  urine dipstick TRACE (A) NEGATIVE   pH 6.0 5.0 - 8.0   Protein, ur NEGATIVE NEGATIVE mg/dL   Urobilinogen, UA 0.2 0.0 - 1.0 mg/dL   Nitrite NEGATIVE NEGATIVE   Leukocytes, UA SMALL (A) NEGATIVE  Pregnancy, urine POC  Result Value Ref Range   Preg Test, Ur NEGATIVE NEGATIVE     Imaging Review No results found.   Visual Acuity Review  Right Eye Distance:   Left Eye Distance:   Bilateral Distance:    Right Eye Near:   Left Eye Near:    Bilateral Near:         MDM   1. PID (acute pelvic inflammatory disease)   2. Cervicitis   3. Pelvic pain in female   4. Herpes simplex of female genitalia   5. Acute vaginitis    Take the medication as directed. Read your instructions accompanying  your discharge papers. Your test results should be back in approximately 24-36 hours. Meds ordered this encounter  Medications  . metroNIDAZOLE (FLAGYL) 500 MG tablet    Sig: Take 1 tablet (500 mg total) by mouth 2 (two) times daily. X 7 days    Dispense:  14 tablet    Refill:  0    Order Specific Question:   Supervising Provider    Answer:   Charm RingsHONIG, ERIN J [id number]]  . valACYclovir (VALTREX) 1000 MG tablet    Sig: Take 1 tablet (1,000 mg total) by mouth 2 (two) times daily.    Dispense:  20 tablet    Refill:  0    Order Specific Question:   Supervising Provider    Answer:   Charm RingsHONIG, ERIN J [id number]]  . cefTRIAXone (ROCEPHIN) injection 250 mg  . azithromycin (ZITHROMAX) tablet 1,000 mg   Urine cult pending Herpetic viral cult pending    Hayden RasmussenDavid Rykin Route, NP 07/26/16 1840    Hayden Rasmussenavid Shabazz Mckey, NP 07/26/16 [phone]

## 2016-07-27 LAB — URINE CULTURE: SPECIAL REQUESTS: NORMAL

## 2016-07-27 LAB — CERVICOVAGINAL ANCILLARY ONLY
CHLAMYDIA, DNA PROBE: NEGATIVE
Neisseria Gonorrhea: NEGATIVE
Wet Prep (BD Affirm): POSITIVE — AB

## 2016-07-30 LAB — HSV CULTURE AND TYPING

## 2016-08-02 ENCOUNTER — Telehealth (HOSPITAL_COMMUNITY): Payer: Self-pay | Admitting: Emergency Medicine

## 2016-08-02 NOTE — Telephone Encounter (Signed)
-----   Message from Eustace MooreLaura W Murray, MD sent at 07/31/2016  8:03 PM EST ----- Please let patient know that tests for herpes virus, for trichomonas, and for gardnerella (bacterial vaginosis) were positive.  [facility]   Urine culture did not clearly demonstrate a UTI.  Recheck for further evalution if symptoms persist.  LM

## 2016-08-02 NOTE — Telephone Encounter (Signed)
LM on both numbers on file for pt [phone] & [phone]) Called to give lab results and to see how pt is doing from recent visit on 07/26/16 Also let pt know labs can be obtained from MyChart

## 2016-08-08 NOTE — Telephone Encounter (Signed)
LM on both numbers on file for pt [phone] & [phone]) Mailed letter as 5th attempt Called to give lab results and to see how pt is doing from recent visit on 07/26/16 Also let pt know labs can be obtained from MyChart

## 2016-12-22 ENCOUNTER — Ambulatory Visit (HOSPITAL_COMMUNITY)
Admission: EM | Admit: 2016-12-22 | Discharge: 2016-12-22 | Disposition: A | Discharge status: Home / Self Care | Payer: Medicaid Other | Attending: Internal Medicine | Admitting: Internal Medicine

## 2016-12-22 ENCOUNTER — Encounter (HOSPITAL_COMMUNITY): Payer: Self-pay | Admitting: Emergency Medicine

## 2016-12-22 DIAGNOSIS — R300 Dysuria: Secondary | ICD-10-CM

## 2016-12-22 DIAGNOSIS — M545 Low back pain: Secondary | ICD-10-CM | POA: Diagnosis not present

## 2016-12-22 DIAGNOSIS — N390 Urinary tract infection, site not specified: Secondary | ICD-10-CM

## 2016-12-22 DIAGNOSIS — F17210 Nicotine dependence, cigarettes, uncomplicated: Secondary | ICD-10-CM | POA: Diagnosis not present

## 2016-12-22 DIAGNOSIS — R350 Frequency of micturition: Secondary | ICD-10-CM | POA: Diagnosis present

## 2016-12-22 DIAGNOSIS — M419 Scoliosis, unspecified: Secondary | ICD-10-CM | POA: Diagnosis not present

## 2016-12-22 LAB — POCT URINALYSIS DIP (DEVICE)
BILIRUBIN URINE: NEGATIVE
Glucose, UA: NEGATIVE mg/dL
KETONES UR: NEGATIVE mg/dL
Nitrite: POSITIVE — AB
Protein, ur: 100 mg/dL — AB
SPECIFIC GRAVITY, URINE: 1.01 (ref 1.005–1.030)
UROBILINOGEN UA: 0.2 mg/dL (ref 0.0–1.0)
pH: 6.5 (ref 5.0–8.0)

## 2016-12-22 MED ORDER — IBUPROFEN 800 MG PO TABS
800.0000 mg | ORAL_TABLET | Freq: Once | ORAL | Status: AC
  Administered 2016-12-22: 800 mg via ORAL

## 2016-12-22 MED ORDER — IBUPROFEN 800 MG PO TABS
ORAL_TABLET | ORAL | Status: AC
  Filled 2016-12-22: qty 1

## 2016-12-22 MED ORDER — CEPHALEXIN 500 MG PO CAPS: 500.0000 mg | ORAL_CAPSULE | Freq: Four times a day (QID) | ORAL | 0 refills | Status: DC

## 2016-12-22 NOTE — ED Triage Notes (Signed)
2 days ago symptoms started.  Patient reports pain with urination, only minimal urine output and pressure

## 2016-12-22 NOTE — ED Provider Notes (Signed)
CSN: [id number]     Arrival date & time 12/22/16  1749 History   None    Chief Complaint  Patient presents with  . Urinary Tract Infection   (Consider location/radiation/quality/duration/timing/severity/associated sxs/prior Treatment) 41 year old female complaining of dysuria, pelvic discomfort, urinary frequency, small volume voids and low back pain bilaterally. Denies fever or chills. Symptoms began 2-3 days ago.      Past Medical History:  Diagnosis Date  . Medical history non-contributory   . No pertinent past medical history   . Scoliosis    Past Surgical History:  Procedure Laterality Date  . NO PAST SURGERIES    . TUBAL LIGATION  06/07/2011   Procedure: POST PARTUM TUBAL LIGATION;  Surgeon: Reva Bores, MD;  Location: [facility];  Service: Gynecology;  Laterality: Bilateral;   No family history on file. Social History  Substance Use Topics  . Smoking status: Current Every Day Smoker    Packs/day: 0.50    Years: 10.00    Types: Cigarettes  . Smokeless tobacco: Never Used  . Alcohol use No   OB History    Gravida Para Term Preterm AB Living   0 0 3   SAB TAB Ectopic Multiple Live Births   0 0 0 0 1     Review of Systems  Constitutional: Negative.   Respiratory: Negative.   Gastrointestinal: Negative.   Genitourinary:       As per history of present illness  All other systems reviewed and are negative.   Allergies  Tylenol [acetaminophen]  Home Medications   Prior to Admission medications   Medication Sig Start Date End Date Taking? Authorizing Provider  benzocaine (ORAJEL) 10 % mucosal gel Use as directed in the mouth or throat 4 (four) times daily as needed for mouth pain. 08/28/13   Charm Rings, NP  ibuprofen (ADVIL,MOTRIN) 800 MG tablet Take 1 tablet (800 mg total) by mouth 3 (three) times daily. 10/26/14   Fayrene Helper, PA-C  loratadine (CLARITIN) 10 MG tablet Take 1 tablet (10 mg total) by mouth daily. 08/28/13   Charm Rings, NP   metroNIDAZOLE (FLAGYL) 500 MG tablet Take 1 tablet (500 mg total) by mouth 2 (two) times daily. X 7 days 07/26/16   Hayden Rasmussen, NP  oxyCODONE-acetaminophen (PERCOCET/ROXICET) 5-325 MG per tablet Take 1-2 tablets by mouth every 4 (four) hours as needed for severe pain. 08/28/13   Charm Rings, NP  pantoprazole (PROTONIX) 20 MG tablet Take 1 tablet (20 mg total) by mouth daily. 08/28/13   Charm Rings, NP  prenatal vitamin w/FE, FA (PRENATAL 1 + 1) 27-1 MG TABS tablet Take 1 tablet by mouth daily. 08/28/13   Charm Rings, NP  traZODone (DESYREL) 50 MG tablet Take 1 tablet (50 mg total) by mouth at bedtime and may repeat dose one time if needed. 08/28/13   Charm Rings, NP  valACYclovir (VALTREX) 1000 MG tablet Take 1 tablet (1,000 mg total) by mouth 2 (two) times daily. 07/26/16   Hayden Rasmussen, NP   Meds Ordered and Administered this Visit   Medications  ibuprofen (ADVIL,MOTRIN) tablet 800 mg (800 mg Oral Given 12/22/16 1849)    BP 114/70 (BP Location: Right Arm)   Pulse 97   Temp 98.4 F (36.9 C) (Oral)   Resp 18   LMP 11/15/2016   SpO2 97%  No data found.   Physical Exam  Constitutional: She is oriented to person, place, and time. She appears well-developed  and well-nourished. No distress.  Eyes: EOM are normal.  Neck: Neck supple.  Cardiovascular: Normal rate.   Pulmonary/Chest: Effort normal. No respiratory distress.  Musculoskeletal: She exhibits no edema.  Neurological: She is alert and oriented to person, place, and time. She exhibits normal muscle tone.  Skin: Skin is warm and dry.  Psychiatric: She has a normal mood and affect.  Nursing note and vitals reviewed.   Urgent Care Course     Procedures (including critical care time)  Labs Review Labs Reviewed  POCT URINALYSIS DIP (DEVICE) - Abnormal; Notable for the following:       Result Value   Hgb urine dipstick MODERATE (*)    Protein, ur 100 (*)    Nitrite POSITIVE (*)    Leukocytes, UA LARGE (*)    All  other components within normal limits  URINE CULTURE    Imaging Review No results found.   Visual Acuity Review  Right Eye Distance:   Left Eye Distance:   Bilateral Distance:    Right Eye Near:   Left Eye Near:    Bilateral Near:         MDM   1. Lower urinary tract infectious disease    When you pick up your prescription for your antibiotic get the medicine called Azo-Standard, generic is phenazopyridine. This will help with your urinary tract infection symptoms. We will also turn her urine reddish orange. Take 2 of these 3 times a day for the next 2-3 days. Drink plenty of fluids and stay well-hydrated. He may take ibuprofen for pain if necessary. Meds ordered this encounter  Medications  . ibuprofen (ADVIL,MOTRIN) tablet 800 mg   Keflex 500 qid    Hayden Rasmussen, NP 12/22/16 1854    Hayden Rasmussen, NP 12/22/16 1857

## 2016-12-22 NOTE — Discharge Instructions (Signed)
When you pick up your prescription for your antibiotic get the medicine called Azo-Standard, generic is phenazopyridine. This will help with your urinary tract infection symptoms. We will also turn her urine reddish orange. Take 2 of these 3 times a day for the next 2-3 days. Drink plenty of fluids and stay well-hydrated. He may take ibuprofen for pain if necessary.

## 2016-12-25 LAB — URINE CULTURE: SPECIAL REQUESTS: NORMAL

## 2017-10-04 ENCOUNTER — Ambulatory Visit (INDEPENDENT_AMBULATORY_CARE_PROVIDER_SITE_OTHER): Payer: Self-pay | Admitting: Physician Assistant

## 2017-10-23 ENCOUNTER — Ambulatory Visit (INDEPENDENT_AMBULATORY_CARE_PROVIDER_SITE_OTHER): Payer: Self-pay | Admitting: Physician Assistant

## 2018-10-12 ENCOUNTER — Other Ambulatory Visit: Payer: Self-pay

## 2018-10-12 ENCOUNTER — Emergency Department (HOSPITAL_COMMUNITY)
Admission: EM | Admit: 2018-10-12 | Discharge: 2018-10-12 | Disposition: A | Discharge status: Home / Self Care | Payer: Medicaid Other | Attending: Emergency Medicine | Admitting: Emergency Medicine

## 2018-10-12 ENCOUNTER — Emergency Department (HOSPITAL_COMMUNITY): Payer: Medicaid Other

## 2018-10-12 DIAGNOSIS — Y929 Unspecified place or not applicable: Secondary | ICD-10-CM | POA: Diagnosis not present

## 2018-10-12 DIAGNOSIS — R52 Pain, unspecified: Secondary | ICD-10-CM

## 2018-10-12 DIAGNOSIS — S0185XA Open bite of other part of head, initial encounter: Secondary | ICD-10-CM | POA: Diagnosis not present

## 2018-10-12 DIAGNOSIS — S0180XA Unspecified open wound of other part of head, initial encounter: Secondary | ICD-10-CM | POA: Diagnosis present

## 2018-10-12 DIAGNOSIS — F17210 Nicotine dependence, cigarettes, uncomplicated: Secondary | ICD-10-CM | POA: Diagnosis not present

## 2018-10-12 DIAGNOSIS — L089 Local infection of the skin and subcutaneous tissue, unspecified: Secondary | ICD-10-CM | POA: Insufficient documentation

## 2018-10-12 DIAGNOSIS — Y999 Unspecified external cause status: Secondary | ICD-10-CM | POA: Insufficient documentation

## 2018-10-12 DIAGNOSIS — W503XXA Accidental bite by another person, initial encounter: Secondary | ICD-10-CM | POA: Diagnosis not present

## 2018-10-12 DIAGNOSIS — R0789 Other chest pain: Secondary | ICD-10-CM

## 2018-10-12 DIAGNOSIS — Z79899 Other long term (current) drug therapy: Secondary | ICD-10-CM | POA: Diagnosis not present

## 2018-10-12 DIAGNOSIS — M549 Dorsalgia, unspecified: Secondary | ICD-10-CM

## 2018-10-12 DIAGNOSIS — Y9389 Activity, other specified: Secondary | ICD-10-CM | POA: Diagnosis not present

## 2018-10-12 LAB — POC URINE PREG, ED: PREG TEST UR: NEGATIVE

## 2018-10-12 MED ORDER — IBUPROFEN 600 MG PO TABS: 600.0000 mg | ORAL_TABLET | Freq: Four times a day (QID) | ORAL | 0 refills | Status: DC | PRN

## 2018-10-12 MED ORDER — CYCLOBENZAPRINE HCL 10 MG PO TABS: 10.0000 mg | ORAL_TABLET | Freq: Two times a day (BID) | ORAL | 0 refills | Status: DC | PRN

## 2018-10-12 MED ORDER — METHOCARBAMOL 500 MG PO TABS
500.0000 mg | ORAL_TABLET | Freq: Once | ORAL | Status: AC
  Administered 2018-10-12: 500 mg via ORAL
  Filled 2018-10-12: qty 1

## 2018-10-12 MED ORDER — AMOXICILLIN-POT CLAVULANATE 875-125 MG PO TABS: 1.0000 | ORAL_TABLET | Freq: Two times a day (BID) | ORAL | 0 refills | Status: DC

## 2018-10-12 MED ORDER — KETOROLAC TROMETHAMINE 30 MG/ML IJ SOLN
30.0000 mg | Freq: Once | INTRAMUSCULAR | Status: AC
  Administered 2018-10-12: 30 mg via INTRAMUSCULAR
  Filled 2018-10-12: qty 1

## 2018-10-12 MED ORDER — AMOXICILLIN-POT CLAVULANATE 875-125 MG PO TABS
1.0000 | ORAL_TABLET | Freq: Once | ORAL | Status: AC
  Administered 2018-10-12: 1 via ORAL
  Filled 2018-10-12: qty 1
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
## 2018-10-12 NOTE — Discharge Instructions (Signed)
Please read instructions below. Apply ice to your areas of pain for 20 minutes at a time. You can take ibuprofen every 6 hours as needed for pain. You can take flexeril every 12 hours for muscle soreness. Be aware this medication can make you drowsy. Take the antibiotic, Augmentin, as prescribed until gone. Apply a warm compress to your wound, multiple times per day to encourage it to continue draining. Schedule an appointment with the your primary care provider on Monday for wound recheck.  Return to the ER for fever, worsening redness to the wound, or new or worsening symptoms.

## 2019-04-18 ENCOUNTER — Ambulatory Visit (HOSPITAL_COMMUNITY)
Admission: EM | Admit: 2019-04-18 | Discharge: 2019-04-18 | Disposition: A | Discharge status: Home / Self Care | Payer: Medicaid Other | Attending: Family Medicine | Admitting: Family Medicine

## 2019-04-18 ENCOUNTER — Encounter (HOSPITAL_COMMUNITY): Payer: Self-pay

## 2019-04-18 ENCOUNTER — Other Ambulatory Visit: Payer: Self-pay

## 2019-04-18 DIAGNOSIS — F329 Major depressive disorder, single episode, unspecified: Secondary | ICD-10-CM

## 2019-04-18 DIAGNOSIS — F418 Other specified anxiety disorders: Secondary | ICD-10-CM | POA: Diagnosis not present

## 2019-04-18 DIAGNOSIS — R030 Elevated blood-pressure reading, without diagnosis of hypertension: Secondary | ICD-10-CM

## 2019-04-18 DIAGNOSIS — F32A Depression, unspecified: Secondary | ICD-10-CM

## 2019-04-18 MED ORDER — LORAZEPAM 1 MG PO TABS: 1.0000 mg | ORAL_TABLET | Freq: Every evening | ORAL | 0 refills | Status: DC | PRN

## 2019-04-18 MED ORDER — FLUOXETINE HCL 10 MG PO TABS: 10.0000 mg | ORAL_TABLET | Freq: Every day | ORAL | 2 refills | Status: DC
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
## 2019-04-18 NOTE — ED Provider Notes (Signed)
Section   [id number] 04/18/19 Arrival Time: 1003  ASSESSMENT & PLAN:  1. Situational anxiety     Plans to est care with PCP. Discussed importance of this.  Follow-up Information    Schedule an appointment as soon as possible for a visit  with Primary Care at [facility].   Specialty: Family Medicine Contact information: [address], [place] [phone]         Begin: Meds ordered this encounter  Medications  . FLUoxetine (PROZAC) 10 MG tablet    Sig: Take 1 tablet (10 mg total) by mouth daily.    Dispense:  30 tablet    Refill:  2  . LORazepam (ATIVAN) 1 MG tablet    Sig: Take 1 tablet (1 mg total) by mouth at bedtime as needed for anxiety.    Dispense:  10 tablet    Refill:  0   Does have a pastor she can talk to. May f/u here at any time to recheck BP.  Reviewed expectations re: course of current medical issues. Questions answered. Outlined signs and symptoms indicating need for more acute intervention. Patient verbalized understanding. After Visit Summary given.   SUBJECTIVE:  Kristi Kelley is a 43 y.o. female who presents with complaint of:  Anxiety: Patient complains of anxiety since her bedroom ceiling fell on her in February of this year.  Mainly problem with insomnia. Also feels sad; cries randomly. Doesn't feel like going out of her house much. She denies current suicidal and homicidal ideation. Family history significant for no psychiatric illness. No new medications. Risk factors: negative life event recent family death. No previous treatment for anxiety/depression. Also reports overall decreased appetite. Occasional generalized headaches; none currently.  Increased blood pressure noted today. Reports that she has not been treated for hypertension in the past.  She reports no chest pain on exertion, no dyspnea on exertion, no swelling of ankles, no orthostatic dizziness or lightheadedness,  no orthopnea or paroxysmal nocturnal dyspnea, no palpitations and no intermittent claudication symptoms.  ROS: As per HPI. All other systems negative.    OBJECTIVE:  Vitals:   04/18/19 1027  BP: (!) 150/87  Pulse: 67  Resp: 17  Temp: 98.4 F (36.9 C)  TempSrc: Oral  SpO2: 98%    General appearance: alert; no distress Eyes: PERRLA; EOMI; conjunctiva normal HENT: normocephalic; atraumatic Neck: supple Lungs: clear to auscultation bilaterally Heart: regular rate and rhythm Abdomen: soft, non-tender  Extremities: no cyanosis or edema; symmetrical with no gross deformities Skin: warm and dry Neurologic: normal gait; normal symmetric reflexes Psychological: alert and cooperative; normal mood and affect but does tear up during visit  Allergies  Allergen Reactions  . Aspirin Hives  . Tylenol [Acetaminophen] Hives    Past Medical History:  Diagnosis Date  . Medical history non-contributory   . No pertinent past medical history   . Scoliosis    Social History   Socioeconomic History  . Marital status: Single    Spouse name: Not on file  . Number of children: Not on file  . Years of education: Not on file  . Highest education level: Not on file  Occupational History  . Not on file  Social Needs  . Financial resource strain: Not on file  . Food insecurity    Worry: Not on file    Inability: Not on file  . Transportation needs    Medical: Not on file    Non-medical: Not on file  Tobacco Use  . Smoking status: Current Every Day Smoker    Packs/day: 0.50    Years: 10.00    Pack years: 5.00    Types: Cigarettes  . Smokeless tobacco: Never Used  Substance and Sexual Activity  . Alcohol use: No  . Drug use: No  . Sexual activity: Yes    Birth control/protection: Surgical  Lifestyle  . Physical activity    Days per week: Not on file    Minutes per session: Not on file  . Stress: Not on file  Relationships  . Social Musicianconnections    Talks on phone: Not on file     Gets together: Not on file    Attends religious service: Not on file    Active member of club or organization: Not on file    Attends meetings of clubs or organizations: Not on file    Relationship status: Not on file  . Intimate partner violence    Fear of current or ex partner: Not on file    Emotionally abused: Not on file    Physically abused: Not on file    Forced sexual activity: Not on file  Other Topics Concern  . Not on file  Social History Narrative  . Not on file   FH: Question of HTN  Past Surgical History:  Procedure Laterality Date  . NO PAST SURGERIES    . TUBAL LIGATION  06/07/2011   Procedure: POST PARTUM TUBAL LIGATION;  Surgeon: Reva Boresanya S Pratt, MD;  Location: [facility];  Service: Gynecology;  Laterality: Bilateral;      Mardella LaymanHagler, Magdalyn Arenivas, MD 04/18/19 1102

## 2019-04-18 NOTE — Discharge Instructions (Signed)
Be aware, the as needed anxiety medicine prescribed may cause drowsiness. Please do not drive, operate heavy machinery or make important decisions while on this medication, it can cloud your judgement.

## 2019-04-24 ENCOUNTER — Telehealth (HOSPITAL_COMMUNITY): Payer: Self-pay | Admitting: Emergency Medicine

## 2019-04-24 MED ORDER — FLUOXETINE HCL 10 MG PO CAPS: 10.0000 mg | ORAL_CAPSULE | Freq: Every day | ORAL | 2 refills | Status: DC

## 2019-04-24 MED ORDER — FLUOXETINE HCL 10 MG PO CAPS
10.0000 mg | ORAL_CAPSULE | Freq: Every day | ORAL | 0 refills | Status: DC
Start: 2019-04-24 — End: 2019-04-24

## 2019-04-24 NOTE — Telephone Encounter (Signed)
Resending medicine with appropriate refills.

## 2019-04-24 NOTE — Telephone Encounter (Signed)
Pt called stating the tablets for prozac were not covered by her insurance. Called [facility] pharmacy to confirm, tablets are not covered. Okay to resend per [facility] PA as capsules.

## 2020-01-13 ENCOUNTER — Ambulatory Visit: Payer: Medicaid Other | Attending: Internal Medicine

## 2020-01-13 DIAGNOSIS — Z20822 Contact with and (suspected) exposure to covid-19: Secondary | ICD-10-CM

## 2020-01-15 ENCOUNTER — Telehealth: Payer: Self-pay | Admitting: General Practice

## 2020-01-15 LAB — NOVEL CORONAVIRUS, NAA: SARS-CoV-2, NAA: NOT DETECTED

## 2020-01-15 LAB — SARS-COV-2, NAA 2 DAY TAT

## 2020-01-15 NOTE — Telephone Encounter (Signed)
Negative COVID results given. Patient results "NOT Detected." Caller expressed understanding. ° °

## 2020-02-27 DIAGNOSIS — Z419 Encounter for procedure for purposes other than remedying health state, unspecified: Secondary | ICD-10-CM | POA: Diagnosis not present

## 2020-03-29 DIAGNOSIS — Z419 Encounter for procedure for purposes other than remedying health state, unspecified: Secondary | ICD-10-CM | POA: Diagnosis not present

## 2020-04-29 DIAGNOSIS — Z419 Encounter for procedure for purposes other than remedying health state, unspecified: Secondary | ICD-10-CM | POA: Diagnosis not present

## 2020-05-19 ENCOUNTER — Emergency Department (HOSPITAL_COMMUNITY)
Admission: EM | Admit: 2020-05-19 | Discharge: 2020-05-19 | Discharge status: Against Medical Advice | Payer: Medicaid Other

## 2020-05-19 DIAGNOSIS — Z743 Need for continuous supervision: Secondary | ICD-10-CM | POA: Diagnosis not present

## 2020-05-19 DIAGNOSIS — T148XXA Other injury of unspecified body region, initial encounter: Secondary | ICD-10-CM | POA: Diagnosis not present

## 2020-05-19 DIAGNOSIS — M79606 Pain in leg, unspecified: Secondary | ICD-10-CM | POA: Diagnosis not present

## 2020-05-19 NOTE — ED Notes (Signed)
Pt called for triage no answer x1. 

## 2020-05-19 NOTE — ED Notes (Signed)
Pt called for triage, no answer x3 

## 2020-05-19 NOTE — ED Notes (Signed)
Pt called for triage, no answer x2 

## 2020-05-29 DIAGNOSIS — Z419 Encounter for procedure for purposes other than remedying health state, unspecified: Secondary | ICD-10-CM | POA: Diagnosis not present

## 2020-06-29 DIAGNOSIS — Z419 Encounter for procedure for purposes other than remedying health state, unspecified: Secondary | ICD-10-CM | POA: Diagnosis not present

## 2020-07-29 DIAGNOSIS — Z419 Encounter for procedure for purposes other than remedying health state, unspecified: Secondary | ICD-10-CM | POA: Diagnosis not present

## 2020-08-29 DIAGNOSIS — Z419 Encounter for procedure for purposes other than remedying health state, unspecified: Secondary | ICD-10-CM | POA: Diagnosis not present

## 2020-09-18 ENCOUNTER — Ambulatory Visit (HOSPITAL_COMMUNITY)
Admission: EM | Admit: 2020-09-18 | Discharge: 2020-09-18 | Disposition: A | Discharge status: Home / Self Care | Payer: Medicaid Other

## 2020-09-18 ENCOUNTER — Emergency Department (HOSPITAL_COMMUNITY): Payer: Medicaid Other

## 2020-09-18 ENCOUNTER — Other Ambulatory Visit: Payer: Self-pay

## 2020-09-18 ENCOUNTER — Emergency Department (HOSPITAL_COMMUNITY)
Admission: EM | Admit: 2020-09-18 | Discharge: 2020-09-19 | Disposition: A | Discharge status: Home / Self Care | Payer: Medicaid Other | Attending: Emergency Medicine | Admitting: Emergency Medicine

## 2020-09-18 ENCOUNTER — Encounter (HOSPITAL_COMMUNITY): Payer: Self-pay | Admitting: Emergency Medicine

## 2020-09-18 DIAGNOSIS — S301XXA Contusion of abdominal wall, initial encounter: Secondary | ICD-10-CM | POA: Diagnosis not present

## 2020-09-18 DIAGNOSIS — S0990XA Unspecified injury of head, initial encounter: Secondary | ICD-10-CM | POA: Diagnosis present

## 2020-09-18 DIAGNOSIS — S299XXA Unspecified injury of thorax, initial encounter: Secondary | ICD-10-CM | POA: Diagnosis not present

## 2020-09-18 DIAGNOSIS — S199XXA Unspecified injury of neck, initial encounter: Secondary | ICD-10-CM | POA: Diagnosis not present

## 2020-09-18 DIAGNOSIS — R000 Tachycardia, unspecified: Secondary | ICD-10-CM | POA: Insufficient documentation

## 2020-09-18 DIAGNOSIS — S3991XA Unspecified injury of abdomen, initial encounter: Secondary | ICD-10-CM | POA: Diagnosis not present

## 2020-09-18 DIAGNOSIS — Z87891 Personal history of nicotine dependence: Secondary | ICD-10-CM | POA: Insufficient documentation

## 2020-09-18 DIAGNOSIS — S0083XA Contusion of other part of head, initial encounter: Secondary | ICD-10-CM | POA: Insufficient documentation

## 2020-09-18 LAB — CBC WITH DIFFERENTIAL/PLATELET
Abs Immature Granulocytes: 0.04 10*3/uL (ref 0.00–0.07)
Basophils Absolute: 0 10*3/uL (ref 0.0–0.1)
Basophils Relative: 1 %
Eosinophils Absolute: 0.2 10*3/uL (ref 0.0–0.5)
Eosinophils Relative: 3 %
HCT: 39.5 % (ref 36.0–46.0)
Hemoglobin: 13.5 g/dL (ref 12.0–15.0)
Immature Granulocytes: 1 %
Lymphocytes Relative: 41 %
Lymphs Abs: 2.1 10*3/uL (ref 0.7–4.0)
MCH: 29.5 pg (ref 26.0–34.0)
MCHC: 34.2 g/dL (ref 30.0–36.0)
MCV: 86.2 fL (ref 80.0–100.0)
Monocytes Absolute: 0.4 10*3/uL (ref 0.1–1.0)
Monocytes Relative: 8 %
Neutro Abs: 2.3 10*3/uL (ref 1.7–7.7)
Neutrophils Relative %: 46 %
Platelets: 430 10*3/uL — ABNORMAL HIGH (ref 150–400)
RBC: 4.58 MIL/uL (ref 3.87–5.11)
RDW: 14 % (ref 11.5–15.5)
WBC: 5 10*3/uL (ref 4.0–10.5)
nRBC: 0 % (ref 0.0–0.2)

## 2020-09-18 LAB — COMPREHENSIVE METABOLIC PANEL
ALT: 104 U/L — ABNORMAL HIGH (ref 0–44)
AST: 119 U/L — ABNORMAL HIGH (ref 15–41)
Albumin: 3 g/dL — ABNORMAL LOW (ref 3.5–5.0)
Alkaline Phosphatase: 108 U/L (ref 38–126)
Anion gap: 14 (ref 5–15)
BUN: <5 mg/dL — ABNORMAL LOW (ref 6–20)
CO2: 22 mmol/L (ref 22–32)
Calcium: 9.1 mg/dL (ref 8.9–10.3)
Chloride: 98 mmol/L (ref 98–111)
Creatinine, Ser: 0.35 mg/dL — ABNORMAL LOW (ref 0.44–1.00)
GFR, Estimated: >60 mL/min (ref >60)
Glucose, Bld: 86 mg/dL (ref 70–99)
Potassium: 3.9 mmol/L (ref 3.5–5.1)
Sodium: 134 mmol/L — ABNORMAL LOW (ref 135–145)
Total Bilirubin: 0.3 mg/dL (ref 0.3–1.2)
Total Protein: 7.1 g/dL (ref 6.5–8.1)

## 2020-09-18 LAB — I-STAT BETA HCG BLOOD, ED (MC, WL, AP ONLY): I-stat hCG, quantitative: <5 m[IU]/mL (ref <5)

## 2020-09-18 MED ORDER — PROMETHAZINE HCL 25 MG PO TABS: 25.0000 mg | ORAL_TABLET | Freq: Three times a day (TID) | ORAL | 0 refills | Status: AC | PRN

## 2020-09-18 MED ORDER — DEXTROSE 5 % IV SOLN
500.0000 mg | Freq: Once | INTRAVENOUS | Status: DC
  Filled 2020-09-18: qty 500

## 2020-09-18 MED ORDER — AZITHROMYCIN 250 MG PO TABS
1000.0000 mg | ORAL_TABLET | Freq: Once | ORAL | Status: AC
  Administered 2020-09-18: 1000 mg via ORAL
  Filled 2020-09-18: qty 4

## 2020-09-18 MED ORDER — ONDANSETRON HCL 4 MG/2ML IJ SOLN
4.0000 mg | Freq: Once | INTRAMUSCULAR | Status: AC
  Administered 2020-09-18: 4 mg via INTRAVENOUS
  Filled 2020-09-18: qty 2

## 2020-09-18 MED ORDER — AMOXICILLIN-POT CLAVULANATE 875-125 MG PO TABS: 1.0000 | ORAL_TABLET | Freq: Two times a day (BID) | ORAL | 0 refills | Status: DC

## 2020-09-18 MED ORDER — CEFTRIAXONE SODIUM 500 MG IJ SOLR
500.0000 mg | Freq: Once | INTRAMUSCULAR | Status: AC
  Administered 2020-09-19: 500 mg via INTRAMUSCULAR
  Filled 2020-09-18: qty 500

## 2020-09-18 MED ORDER — IOHEXOL 300 MG/ML  SOLN
100.0000 mL | Freq: Once | INTRAMUSCULAR | Status: AC | PRN
  Administered 2020-09-18: 100 mL via INTRAVENOUS

## 2020-09-18 NOTE — ED Notes (Signed)
Pt advised to go to ED for further evaluation 

## 2020-09-18 NOTE — SANE Note (Signed)
At 1830, I received a call from Dr. Madilyn Hook about this patient. I notified Meriel Pica, night nurse, about the patient.
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
## 2020-09-18 NOTE — ED Notes (Signed)
SANE nurse at pt bedside
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
## 2020-09-19 NOTE — ED Notes (Signed)
E-signature pad unavailable at time of pt discharge. This RN discussed discharge materials with pt and answered all pt questions. Pt stated understanding of discharge material. ? ?

## 2020-09-21 NOTE — SANE Note (Signed)
The SANE/FNE (Forensic Nurse Examiner) consult has been completed. The primary RN and/or provider have been notified. Please contact the SANE/FNE nurse on call (listed in Amion) with any further concerns.  

## 2020-09-22 LAB — I-STAT BETA HCG BLOOD, ED (MC, WL, AP ONLY): I-stat hCG, quantitative: <5 m[IU]/mL (ref <5)

## 2020-09-29 DIAGNOSIS — Z419 Encounter for procedure for purposes other than remedying health state, unspecified: Secondary | ICD-10-CM | POA: Diagnosis not present

## 2020-10-27 DIAGNOSIS — Z419 Encounter for procedure for purposes other than remedying health state, unspecified: Secondary | ICD-10-CM | POA: Diagnosis not present

## 2020-11-23 ENCOUNTER — Ambulatory Visit (INDEPENDENT_AMBULATORY_CARE_PROVIDER_SITE_OTHER): Payer: Medicaid Other | Admitting: Primary Care

## 2020-11-23 ENCOUNTER — Encounter (INDEPENDENT_AMBULATORY_CARE_PROVIDER_SITE_OTHER): Payer: Self-pay | Admitting: Primary Care

## 2020-11-23 ENCOUNTER — Other Ambulatory Visit: Payer: Self-pay

## 2020-11-23 VITALS — BP 138/98 | HR 92 | Temp 97.3°F | Ht 61.0 in | Wt 151.6 lb

## 2020-11-23 DIAGNOSIS — Z09 Encounter for follow-up examination after completed treatment for conditions other than malignant neoplasm: Secondary | ICD-10-CM | POA: Diagnosis not present

## 2020-11-23 DIAGNOSIS — F411 Generalized anxiety disorder: Secondary | ICD-10-CM

## 2020-11-23 DIAGNOSIS — I10 Essential (primary) hypertension: Secondary | ICD-10-CM

## 2020-11-23 DIAGNOSIS — Z13220 Encounter for screening for lipoid disorders: Secondary | ICD-10-CM

## 2020-11-23 DIAGNOSIS — F1010 Alcohol abuse, uncomplicated: Secondary | ICD-10-CM

## 2020-11-23 DIAGNOSIS — Z7689 Persons encountering health services in other specified circumstances: Secondary | ICD-10-CM | POA: Diagnosis not present

## 2020-11-23 MED ORDER — HYDROCHLOROTHIAZIDE 25 MG PO TABS: 25.0000 mg | ORAL_TABLET | Freq: Every day | ORAL | 3 refills | Status: AC

## 2020-11-23 MED ORDER — FLUOXETINE HCL 20 MG PO CAPS: 20.0000 mg | ORAL_CAPSULE | Freq: Every day | ORAL | 1 refills | Status: DC

## 2020-11-23 NOTE — Patient Instructions (Signed)
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
## 2020-11-24 LAB — CBC WITH DIFFERENTIAL/PLATELET
Basophils Absolute: 0 10*3/uL (ref 0.0–0.2)
Basos: 0 %
EOS (ABSOLUTE): 0.1 10*3/uL (ref 0.0–0.4)
Eos: 1 %
Hematocrit: 40.7 % (ref 34.0–46.6)
Hemoglobin: 13.8 g/dL (ref 11.1–15.9)
Immature Grans (Abs): 0 10*3/uL (ref 0.0–0.1)
Immature Granulocytes: 1 %
Lymphocytes Absolute: 1.8 10*3/uL (ref 0.7–3.1)
Lymphs: 31 %
MCH: 28.8 pg (ref 26.6–33.0)
MCHC: 33.9 g/dL (ref 31.5–35.7)
MCV: 85 fL (ref 79–97)
Monocytes Absolute: 0.6 10*3/uL (ref 0.1–0.9)
Monocytes: 12 %
Neutrophils Absolute: 3.1 10*3/uL (ref 1.4–7.0)
Neutrophils: 55 %
Platelets: 297 10*3/uL (ref 150–450)
RBC: 4.8 x10E6/uL (ref 3.77–5.28)
RDW: 13.6 % (ref 11.7–15.4)
WBC: 5.6 10*3/uL (ref 3.4–10.8)

## 2020-11-24 LAB — CMP14+EGFR
ALT: 157 IU/L — ABNORMAL HIGH (ref 0–32)
AST: 143 IU/L — ABNORMAL HIGH (ref 0–40)
Albumin/Globulin Ratio: 1.3 (ref 1.2–2.2)
Albumin: 4.4 g/dL (ref 3.8–4.8)
Alkaline Phosphatase: 186 IU/L — ABNORMAL HIGH (ref 44–121)
BUN/Creatinine Ratio: 15 (ref 9–23)
BUN: 7 mg/dL (ref 6–24)
Bilirubin Total: <0.2 mg/dL (ref 0.0–1.2)
CO2: 22 mmol/L (ref 20–29)
Calcium: 9.7 mg/dL (ref 8.7–10.2)
Chloride: 100 mmol/L (ref 96–106)
Creatinine, Ser: 0.48 mg/dL — ABNORMAL LOW (ref 0.57–1.00)
Globulin, Total: 3.4 g/dL (ref 1.5–4.5)
Glucose: 100 mg/dL — ABNORMAL HIGH (ref 65–99)
Potassium: 4.1 mmol/L (ref 3.5–5.2)
Sodium: 138 mmol/L (ref 134–144)
Total Protein: 7.8 g/dL (ref 6.0–8.5)
eGFR: 121 mL/min/{1.73_m2} (ref >59)

## 2020-11-24 LAB — LIPID PANEL
Chol/HDL Ratio: 1.6 ratio (ref 0.0–4.4)
Cholesterol, Total: 209 mg/dL — ABNORMAL HIGH (ref 100–199)
HDL: 130 mg/dL (ref >39)
LDL Chol Calc (NIH): 67 mg/dL (ref 0–99)
Triglycerides: 67 mg/dL (ref 0–149)
VLDL Cholesterol Cal: 12 mg/dL (ref 5–40)

## 2020-11-24 LAB — TSH+FREE T4
Free T4: 0.83 ng/dL (ref 0.82–1.77)
TSH: 1.2 u[IU]/mL (ref 0.450–4.500)

## 2020-11-26 ENCOUNTER — Telehealth (INDEPENDENT_AMBULATORY_CARE_PROVIDER_SITE_OTHER): Payer: Self-pay

## 2020-11-26 NOTE — Telephone Encounter (Signed)
-----   Message from Grayce Sessions, NP sent at 11/25/2020  3:24 PM EDT ----- Thyroid is normal. AST and ALT both elevated. These are enzymes used to check liver function.  Some causes of an elevation of AST/ALT may be increased alcohol consumption, viral hepatitis, or an immune deficiency.

## 2020-11-26 NOTE — Telephone Encounter (Signed)
Patient is aware of results per PCP. Kristi Kelley, CMA  

## 2020-11-27 DIAGNOSIS — Z419 Encounter for procedure for purposes other than remedying health state, unspecified: Secondary | ICD-10-CM | POA: Diagnosis not present

## 2020-12-27 DIAGNOSIS — Z419 Encounter for procedure for purposes other than remedying health state, unspecified: Secondary | ICD-10-CM | POA: Diagnosis not present

## 2021-01-04 ENCOUNTER — Ambulatory Visit (INDEPENDENT_AMBULATORY_CARE_PROVIDER_SITE_OTHER): Payer: Medicaid Other | Admitting: Primary Care

## 2021-01-15 ENCOUNTER — Ambulatory Visit (INDEPENDENT_AMBULATORY_CARE_PROVIDER_SITE_OTHER): Payer: Medicaid Other | Admitting: Primary Care

## 2021-01-27 DIAGNOSIS — Z419 Encounter for procedure for purposes other than remedying health state, unspecified: Secondary | ICD-10-CM | POA: Diagnosis not present

## 2021-02-26 DIAGNOSIS — Z419 Encounter for procedure for purposes other than remedying health state, unspecified: Secondary | ICD-10-CM | POA: Diagnosis not present

## 2021-03-29 DIAGNOSIS — Z419 Encounter for procedure for purposes other than remedying health state, unspecified: Secondary | ICD-10-CM | POA: Diagnosis not present

## 2021-04-29 DIAGNOSIS — Z419 Encounter for procedure for purposes other than remedying health state, unspecified: Secondary | ICD-10-CM | POA: Diagnosis not present

## 2021-05-17 ENCOUNTER — Other Ambulatory Visit (INDEPENDENT_AMBULATORY_CARE_PROVIDER_SITE_OTHER): Payer: Self-pay | Admitting: Primary Care

## 2021-05-17 DIAGNOSIS — F411 Generalized anxiety disorder: Secondary | ICD-10-CM

## 2021-05-17 NOTE — Telephone Encounter (Signed)
Sent to PCP to refill  

## 2021-05-17 NOTE — Telephone Encounter (Signed)
Needs appt

## 2021-05-29 DIAGNOSIS — Z419 Encounter for procedure for purposes other than remedying health state, unspecified: Secondary | ICD-10-CM | POA: Diagnosis not present

## 2021-06-29 DIAGNOSIS — Z419 Encounter for procedure for purposes other than remedying health state, unspecified: Secondary | ICD-10-CM | POA: Diagnosis not present

## 2021-07-29 DIAGNOSIS — Z419 Encounter for procedure for purposes other than remedying health state, unspecified: Secondary | ICD-10-CM | POA: Diagnosis not present

## 2021-08-29 DIAGNOSIS — Z419 Encounter for procedure for purposes other than remedying health state, unspecified: Secondary | ICD-10-CM | POA: Diagnosis not present

## 2021-09-29 DIAGNOSIS — Z419 Encounter for procedure for purposes other than remedying health state, unspecified: Secondary | ICD-10-CM | POA: Diagnosis not present

## 2021-10-27 DIAGNOSIS — Z419 Encounter for procedure for purposes other than remedying health state, unspecified: Secondary | ICD-10-CM | POA: Diagnosis not present

## 2021-11-27 DIAGNOSIS — Z419 Encounter for procedure for purposes other than remedying health state, unspecified: Secondary | ICD-10-CM | POA: Diagnosis not present

## 2021-12-27 DIAGNOSIS — Z419 Encounter for procedure for purposes other than remedying health state, unspecified: Secondary | ICD-10-CM | POA: Diagnosis not present

## 2022-01-27 DIAGNOSIS — Z419 Encounter for procedure for purposes other than remedying health state, unspecified: Secondary | ICD-10-CM | POA: Diagnosis not present

## 2022-02-26 DIAGNOSIS — Z419 Encounter for procedure for purposes other than remedying health state, unspecified: Secondary | ICD-10-CM | POA: Diagnosis not present

## 2022-03-29 DIAGNOSIS — Z419 Encounter for procedure for purposes other than remedying health state, unspecified: Secondary | ICD-10-CM | POA: Diagnosis not present

## 2022-04-29 DIAGNOSIS — Z419 Encounter for procedure for purposes other than remedying health state, unspecified: Secondary | ICD-10-CM | POA: Diagnosis not present

## 2022-05-29 DIAGNOSIS — Z419 Encounter for procedure for purposes other than remedying health state, unspecified: Secondary | ICD-10-CM | POA: Diagnosis not present

## 2022-06-29 DIAGNOSIS — Z419 Encounter for procedure for purposes other than remedying health state, unspecified: Secondary | ICD-10-CM | POA: Diagnosis not present

## 2022-07-11 ENCOUNTER — Encounter (HOSPITAL_COMMUNITY): Payer: Self-pay

## 2022-07-11 ENCOUNTER — Ambulatory Visit (HOSPITAL_COMMUNITY)
Admission: EM | Admit: 2022-07-11 | Discharge: 2022-07-11 | Disposition: A | Discharge status: Home / Self Care | Payer: Medicaid Other

## 2022-07-11 DIAGNOSIS — R4020 Unspecified coma: Secondary | ICD-10-CM

## 2022-07-11 DIAGNOSIS — K921 Melena: Secondary | ICD-10-CM

## 2022-07-11 DIAGNOSIS — B349 Viral infection, unspecified: Secondary | ICD-10-CM

## 2022-07-11 NOTE — ED Notes (Signed)
Patient is being discharged from the Urgent Care and sent to the Emergency Department via car . Per  PA ngowzi weddburn, patient is in need of higher level of care due to HIGHER LEVEL OF CARE. Patient is aware and verbalizes understanding of plan of care.  Vitals:   07/11/22 1054  BP: (!) 158/91  Pulse: 88  Resp: 16  Temp: 98.2 F (36.8 C)  SpO2: 98%

## 2022-07-11 NOTE — ED Triage Notes (Signed)
Pt is here for sore throat, cough, nasal congetion, runny nose , unable to hold fluid down, headache, chills body aches nausea , . Pt stated she had pass out . X 4days

## 2022-07-11 NOTE — ED Provider Notes (Addendum)
[facility]    CSN: [id number] Arrival date & time: 07/11/22  4431      History   Chief Complaint Chief Complaint  Patient presents with   Fever   Emesis   Cough   Back Pain   Otalgia   Nausea   Sore Throat    HPI Kristi Kelley is a 46 y.o. female. Patient c/o nausea, fever, generalized body aches, nasal congestion and blood in stool for the past 4 days.  Reports having a loss of consciousness at home with difficulty  remembering what happened.  Patient denies any if again significant past medical history. Patient is being sent to the emergency department for higher level of care and further evaluation. Patients sister is at bedside.    Fever Associated symptoms: cough, ear pain and vomiting   Emesis Associated symptoms: cough and fever   Cough Associated symptoms: ear pain and fever   Back Pain Associated symptoms: fever   Otalgia Associated symptoms: cough, fever and vomiting   Sore Throat    Past Medical History:  Diagnosis Date   Medical history non-contributory    No pertinent past medical history    Scoliosis     Patient Active Problem List   Diagnosis Date Noted   MDD (major depressive disorder) 08/26/2013   Sterilization 06/07/2011    Past Surgical History:  Procedure Laterality Date   NO PAST SURGERIES     TUBAL LIGATION  06/07/2011   Procedure: POST PARTUM TUBAL LIGATION;  Surgeon: Reva Bores, MD;  Location: [facility];  Service: Gynecology;  Laterality: Bilateral;    OB History     Gravida  4   Para  3   Term  3   Preterm  0   AB  0   Living  3      SAB  0   IAB  0   Ectopic  0   Multiple  0   Live Births  1            Home Medications    Prior to Admission medications   Medication Sig Start Date End Date Taking? Authorizing Provider  FLUoxetine (PROZAC) 20 MG capsule TAKE 1 CAPSULE(20 MG) BY MOUTH AT BEDTIME 05/17/21   Grayce Sessions, NP  hydrochlorothiazide (HYDRODIURIL) 25 MG tablet Take 1  tablet (25 mg total) by mouth daily. 11/23/20   Grayce Sessions, NP  loratadine (CLARITIN) 10 MG tablet Take 1 tablet (10 mg total) by mouth daily. 08/28/13   Charm Rings, NP  promethazine (PHENERGAN) 25 MG tablet Take 1 tablet (25 mg total) by mouth every 8 (eight) hours as needed for nausea or vomiting. 09/18/20   Tilden Fossa, MD  traZODone (DESYREL) 50 MG tablet Take 1 tablet (50 mg total) by mouth at bedtime and may repeat dose one time if needed. 08/28/13 04/18/19  Charm Rings, NP    Family History Family History  Problem Relation Age of Onset   Healthy Mother    Healthy Father     Social History Social History   Tobacco Use   Smoking status: Former    Packs/day: 0.50    Years: 10.00    Total pack years: 5.00    Types: Cigarettes   Smokeless tobacco: Never  Vaping Use   Vaping Use: Never used  Substance Use Topics   Alcohol use: Yes    Alcohol/week: 1.0 - 2.0 standard drink of alcohol    Types: 1 - 2  Glasses of wine per week   Drug use: No     Allergies   Aspirin and Tylenol [acetaminophen]   Review of Systems Review of Systems  Constitutional:  Positive for fever.  HENT:  Positive for ear pain.   Respiratory:  Positive for cough.   Gastrointestinal:  Positive for vomiting.  Musculoskeletal:  Positive for back pain.     Physical Exam Triage Vital Signs ED Triage Vitals [07/11/22 1054]  Enc Vitals Group     BP (!) 158/91     Pulse Rate 88     Resp 16     Temp 98.2 F (36.8 C)     Temp Source Oral     SpO2 98 %     Weight      Height      Head Circumference      Peak Flow      Pain Score      Pain Loc      Pain Edu?      Excl. in GC?    No data found.  Updated Vital Signs BP (!) 158/91 (BP Location: Left Arm)   Pulse 88   Temp 98.2 F (36.8 C) (Oral)   Resp 16   SpO2 98%     Physical Exam   UC Treatments / Results  Labs (all labs ordered are listed, but only abnormal results are displayed) Labs Reviewed - No data to  display  EKG   Radiology No results found.  Procedures Procedures (including critical care time)  Medications Ordered in UC Medications - No data to display  Initial Impression / Assessment and Plan / UC Course  I have reviewed the triage vital signs and the nursing notes.  Pertinent labs & imaging results that were available during my care of the patient were reviewed by me and considered in my medical decision making (see chart for details).     Patient is being sent to Emergency Department for a higher level of care and further evaluation. Patients sister is taking patient to the Emergency Department.  Final Clinical Impressions(s) / UC Diagnoses   Final diagnoses:  Blood in stool  Viral illness  Loss of consciousness [facility])     Discharge Instructions      Please go to [facility] for further evaluation.      ED Prescriptions   None    PDMP not reviewed this encounter.   Debby Freiberg, NP 07/11/22 1127    Debby Freiberg, NP 07/11/22 1128

## 2022-07-11 NOTE — Discharge Instructions (Signed)
Please go to [facility] emergency department for further evaluation.

## 2022-07-29 DIAGNOSIS — Z419 Encounter for procedure for purposes other than remedying health state, unspecified: Secondary | ICD-10-CM | POA: Diagnosis not present

## 2022-08-29 DIAGNOSIS — Z419 Encounter for procedure for purposes other than remedying health state, unspecified: Secondary | ICD-10-CM | POA: Diagnosis not present

## 2022-09-29 DIAGNOSIS — Z419 Encounter for procedure for purposes other than remedying health state, unspecified: Secondary | ICD-10-CM | POA: Diagnosis not present

## 2022-10-28 DIAGNOSIS — Z419 Encounter for procedure for purposes other than remedying health state, unspecified: Secondary | ICD-10-CM | POA: Diagnosis not present
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
## 2022-11-28 DIAGNOSIS — Z419 Encounter for procedure for purposes other than remedying health state, unspecified: Secondary | ICD-10-CM | POA: Diagnosis not present

## 2022-12-28 DIAGNOSIS — Z419 Encounter for procedure for purposes other than remedying health state, unspecified: Secondary | ICD-10-CM | POA: Diagnosis not present

## 2023-01-28 DIAGNOSIS — Z419 Encounter for procedure for purposes other than remedying health state, unspecified: Secondary | ICD-10-CM | POA: Diagnosis not present

## 2023-02-27 DIAGNOSIS — Z419 Encounter for procedure for purposes other than remedying health state, unspecified: Secondary | ICD-10-CM | POA: Diagnosis not present

## 2023-03-22 ENCOUNTER — Encounter (HOSPITAL_COMMUNITY): Payer: Self-pay

## 2023-03-22 ENCOUNTER — Ambulatory Visit (HOSPITAL_COMMUNITY)
Admission: EM | Admit: 2023-03-22 | Discharge: 2023-03-22 | Disposition: A | Discharge status: Home / Self Care | Payer: 59 | Attending: Nurse Practitioner | Admitting: Nurse Practitioner

## 2023-03-22 DIAGNOSIS — N3001 Acute cystitis with hematuria: Secondary | ICD-10-CM | POA: Diagnosis not present

## 2023-03-22 DIAGNOSIS — Z3202 Encounter for pregnancy test, result negative: Secondary | ICD-10-CM | POA: Diagnosis not present

## 2023-03-22 LAB — POCT URINALYSIS DIP (MANUAL ENTRY)
Bilirubin, UA: NEGATIVE
Glucose, UA: NEGATIVE mg/dL
Ketones, POC UA: NEGATIVE mg/dL
Nitrite, UA: POSITIVE — AB
Protein Ur, POC: NEGATIVE mg/dL
Spec Grav, UA: 1.025 (ref 1.010–1.025)
Urobilinogen, UA: 0.2 E.U./dL
pH, UA: 5.5 (ref 5.0–8.0)

## 2023-03-22 LAB — POCT URINE PREGNANCY: Preg Test, Ur: NEGATIVE

## 2023-03-22 MED ORDER — NITROFURANTOIN MONOHYD MACRO 100 MG PO CAPS: 100.0000 mg | ORAL_CAPSULE | Freq: Two times a day (BID) | ORAL | 0 refills | Status: AC

## 2023-03-22 MED ORDER — PHENAZOPYRIDINE HCL 100 MG PO TABS: 100.0000 mg | ORAL_TABLET | Freq: Three times a day (TID) | ORAL | 0 refills | Status: AC | PRN

## 2023-03-22 NOTE — ED Triage Notes (Signed)
Here for lower abdominal pain and cramping x 3 days.  Pt reports lower back pain. Denies any injury or falls.

## 2023-03-22 NOTE — Discharge Instructions (Addendum)
You have a urinary tract infection.  Take the Macrobid as prescribed to treat it.  You can also take the Pyridium (this will dye urine bright orange) every 8 hours as needed for bladder pain.  Make sure you are drinking plenty of water.  We will call you later this week if the urine culture shows we need to change the antibiotic.

## 2023-03-22 NOTE — ED Provider Notes (Addendum)
[facility]    CSN: [id number] Arrival date & time: 03/22/23  6578      History   Chief Complaint Chief Complaint  Patient presents with   Abdominal Cramping   Back Pain    HPI Kristi Kelley is a 47 y.o. female.   Patient presents today for 3-day history of urinary frequency, urgency, voiding small amounts, foul urinary odor, and bilateral lower abdominal pain worse on the left side.  Reports the pain is wrapping around to her left low back and spasming occasionally.  No fever, burning with urination, new urinary incontinence, blood in the urine, flank pain, nausea/vomiting.  Has been drinking Cambria juice and taking cranberry tablets without relief in symptoms.  No vaginal discharge.  Reports she sexually active, no concern for STIs today.  Patient reports last UTI was more than 5 years ago.  Reports last menstrual period was approximately 2 months ago.  Reports this is common for her to skip periods and spotting between periods.    Past Medical History:  Diagnosis Date   Medical history non-contributory    No pertinent past medical history    Scoliosis     Patient Active Problem List   Diagnosis Date Noted   MDD (major depressive disorder) 08/26/2013   Sterilization 06/07/2011    Past Surgical History:  Procedure Laterality Date   NO PAST SURGERIES     TUBAL LIGATION  06/07/2011   Procedure: POST PARTUM TUBAL LIGATION;  Surgeon: Reva Bores, MD;  Location: [facility];  Service: Gynecology;  Laterality: Bilateral;    OB History     Gravida  4   Para  3   Term  3   Preterm  0   AB  0   Living  3      SAB  0   IAB  0   Ectopic  0   Multiple  0   Live Births  1            Home Medications    Prior to Admission medications   Medication Sig Start Date End Date Taking? Authorizing Provider  nitrofurantoin, macrocrystal-monohydrate, (MACROBID) 100 MG capsule Take 1 capsule (100 mg total) by mouth 2 (two) times daily for 5 days.  03/22/23 03/27/23 Yes Valentino Nose, NP  phenazopyridine (PYRIDIUM) 100 MG tablet Take 1 tablet (100 mg total) by mouth 3 (three) times daily as needed for pain. 03/22/23  Yes Valentino Nose, NP  FLUoxetine (PROZAC) 20 MG capsule TAKE 1 CAPSULE(20 MG) BY MOUTH AT BEDTIME 05/17/21   Grayce Sessions, NP  hydrochlorothiazide (HYDRODIURIL) 25 MG tablet Take 1 tablet (25 mg total) by mouth daily. 11/23/20   Grayce Sessions, NP  loratadine (CLARITIN) 10 MG tablet Take 1 tablet (10 mg total) by mouth daily. 08/28/13   Charm Rings, NP  promethazine (PHENERGAN) 25 MG tablet Take 1 tablet (25 mg total) by mouth every 8 (eight) hours as needed for nausea or vomiting. 09/18/20   Tilden Fossa, MD  traZODone (DESYREL) 50 MG tablet Take 1 tablet (50 mg total) by mouth at bedtime and may repeat dose one time if needed. 08/28/13 04/18/19  Charm Rings, NP    Family History Family History  Problem Relation Age of Onset   Healthy Mother    Healthy Father     Social History Social History   Tobacco Use   Smoking status: Former    Current packs/day: 0.50    Average  packs/day: 0.5 packs/day for 10.0 years (5.0 ttl pk-yrs)    Types: Cigarettes   Smokeless tobacco: Never  Vaping Use   Vaping status: Never Used  Substance Use Topics   Alcohol use: Yes    Alcohol/week: 1.0 - 2.0 standard drink of alcohol    Types: 1 - 2 Glasses of wine per week   Drug use: No     Allergies   Aspirin and Tylenol [acetaminophen]   Review of Systems Review of Systems   Physical Exam Triage Vital Signs ED Triage Vitals [03/22/23 1023]  Encounter Vitals Group     BP (!) 144/88     Systolic BP Percentile      Diastolic BP Percentile      Pulse Rate 97     Resp 16     Temp 98.4 F (36.9 C)     Temp Source Oral     SpO2 98 %     Weight      Height      Head Circumference      Peak Flow      Pain Score      Pain Loc      Pain Education      Exclude from Growth Chart    No data  found.  Updated Vital Signs BP (!) 144/88 (BP Location: Left Arm)   Pulse 97   Temp 98.4 F (36.9 C) (Oral)   Resp 16   LMP 02/01/2023   SpO2 98%   Visual Acuity Right Eye Distance:   Left Eye Distance:   Bilateral Distance:    Right Eye Near:   Left Eye Near:    Bilateral Near:     Physical Exam Vitals and nursing note reviewed.  Constitutional:      General: She is not in acute distress.    Appearance: She is not toxic-appearing.  Pulmonary:     Effort: Pulmonary effort is normal. No respiratory distress.  Abdominal:     General: Abdomen is flat. Bowel sounds are normal. There is no distension.     Palpations: Abdomen is soft. There is no mass.     Tenderness: There is no abdominal tenderness. There is no right CVA tenderness, left CVA tenderness or guarding.  Skin:    General: Skin is warm and dry.     Coloration: Skin is not jaundiced or pale.     Findings: No erythema.  Neurological:     Mental Status: She is alert and oriented to person, place, and time.     Motor: No weakness.     Gait: Gait normal.  Psychiatric:        Behavior: Behavior is cooperative.      UC Treatments / Results  Labs (all labs ordered are listed, but only abnormal results are displayed) Labs Reviewed  POCT URINALYSIS DIP (MANUAL ENTRY) - Abnormal; Notable for the following components:      Result Value   Color, UA straw (*)    Clarity, UA hazy (*)    Blood, UA trace-intact (*)    Nitrite, UA Positive (*)    Leukocytes, UA Large (3+) (*)    All other components within normal limits  URINE CULTURE  POCT URINE PREGNANCY    EKG   Radiology No results found.  Procedures Procedures (including critical care time)  Medications Ordered in UC Medications - No data to display  Initial Impression / Assessment and Plan / UC Course  I have reviewed the triage vital  signs and the nursing notes.  Pertinent labs & imaging results that were available during my care of the patient  were reviewed by me and considered in my medical decision making (see chart for details).   Patient is well-appearing, normotensive, afebrile, not tachycardic, not tachypneic, oxygenating well on room air.    1. Acute cystitis with hematuria Urinalysis today with trace intact blood, positive nitrites, 3+ leukocyte esterase Urine cultures pending Treat with Macrobid twice daily for 5 days Urine culture reviewed from previous-last in 2018 showed no resistance Start Pyridium as needed for bladder pain Strict ER and return precautions discussed with patient  2. Urine pregnancy test negative    The patient was given the opportunity to ask questions.  All questions answered to their satisfaction.  The patient is in agreement to this plan.    Final Clinical Impressions(s) / UC Diagnoses   Final diagnoses:  Acute cystitis with hematuria     Discharge Instructions      You have a urinary tract infection.  Take the Macrobid as prescribed to treat it.  You can also take the Pyridium (this will dye urine bright orange) every 8 hours as needed for bladder pain.  Make sure you are drinking plenty of water.  We will call you later this week if the urine culture shows we need to change the antibiotic.     ED Prescriptions     Medication Sig Dispense Auth. Provider   nitrofurantoin, macrocrystal-monohydrate, (MACROBID) 100 MG capsule Take 1 capsule (100 mg total) by mouth 2 (two) times daily for 5 days. 10 capsule Cathlean Marseilles A, NP   phenazopyridine (PYRIDIUM) 100 MG tablet Take 1 tablet (100 mg total) by mouth 3 (three) times daily as needed for pain. 10 tablet Valentino Nose, NP      PDMP not reviewed this encounter.   Valentino Nose, NP 03/22/23 1059    Valentino Nose, NP 03/22/23 1059

## 2023-03-23 LAB — URINE CULTURE

## 2023-03-30 DIAGNOSIS — Z419 Encounter for procedure for purposes other than remedying health state, unspecified: Secondary | ICD-10-CM | POA: Diagnosis not present

## 2023-04-07 ENCOUNTER — Telehealth (INDEPENDENT_AMBULATORY_CARE_PROVIDER_SITE_OTHER): Payer: Self-pay | Admitting: Primary Care

## 2023-04-07 NOTE — Telephone Encounter (Signed)
Left VM with pt.

## 2023-04-10 ENCOUNTER — Ambulatory Visit (INDEPENDENT_AMBULATORY_CARE_PROVIDER_SITE_OTHER): Payer: 59 | Admitting: Primary Care

## 2023-04-10 ENCOUNTER — Telehealth (INDEPENDENT_AMBULATORY_CARE_PROVIDER_SITE_OTHER): Payer: Self-pay | Admitting: Primary Care

## 2023-04-10 NOTE — Telephone Encounter (Signed)
Called but was unable to leave VM

## 2023-04-22 ENCOUNTER — Emergency Department (HOSPITAL_COMMUNITY)
Admission: EM | Admit: 2023-04-22 | Discharge: 2023-04-23 | Disposition: A | Discharge status: Home / Self Care | Payer: 59 | Attending: Emergency Medicine | Admitting: Emergency Medicine

## 2023-04-22 ENCOUNTER — Encounter (HOSPITAL_COMMUNITY): Payer: Self-pay

## 2023-04-22 ENCOUNTER — Other Ambulatory Visit: Payer: Self-pay

## 2023-04-22 DIAGNOSIS — R079 Chest pain, unspecified: Secondary | ICD-10-CM | POA: Diagnosis not present

## 2023-04-22 DIAGNOSIS — R202 Paresthesia of skin: Secondary | ICD-10-CM | POA: Diagnosis not present

## 2023-04-22 DIAGNOSIS — R52 Pain, unspecified: Secondary | ICD-10-CM | POA: Diagnosis not present

## 2023-04-22 DIAGNOSIS — R0789 Other chest pain: Secondary | ICD-10-CM | POA: Diagnosis not present

## 2023-04-22 DIAGNOSIS — R531 Weakness: Secondary | ICD-10-CM | POA: Insufficient documentation

## 2023-04-22 DIAGNOSIS — M791 Myalgia, unspecified site: Secondary | ICD-10-CM | POA: Diagnosis not present

## 2023-04-22 NOTE — ED Triage Notes (Signed)
Pt to ED by POV from home with c/o CP and left sided body aches which began this evening. 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 Pt is tearful in triage. VSS,

## 2023-04-23 ENCOUNTER — Emergency Department (HOSPITAL_COMMUNITY): Payer: 59

## 2023-04-23 DIAGNOSIS — R52 Pain, unspecified: Secondary | ICD-10-CM | POA: Diagnosis not present

## 2023-04-23 DIAGNOSIS — R079 Chest pain, unspecified: Secondary | ICD-10-CM | POA: Diagnosis not present

## 2023-04-23 DIAGNOSIS — R202 Paresthesia of skin: Secondary | ICD-10-CM | POA: Diagnosis not present

## 2023-04-23 DIAGNOSIS — R531 Weakness: Secondary | ICD-10-CM | POA: Diagnosis not present

## 2023-04-23 LAB — CBC
HCT: 42.9 % (ref 36.0–46.0)
Hemoglobin: 14.5 g/dL (ref 12.0–15.0)
MCH: 28.6 pg (ref 26.0–34.0)
MCHC: 33.8 g/dL (ref 30.0–36.0)
MCV: 84.6 fL (ref 80.0–100.0)
Platelets: 348 10*3/uL (ref 150–400)
RBC: 5.07 MIL/uL (ref 3.87–5.11)
RDW: 14 % (ref 11.5–15.5)
WBC: 5.4 10*3/uL (ref 4.0–10.5)
nRBC: 0 % (ref 0.0–0.2)

## 2023-04-23 LAB — BASIC METABOLIC PANEL
Anion gap: 19 — ABNORMAL HIGH (ref 5–15)
BUN: <5 mg/dL — ABNORMAL LOW (ref 6–20)
CO2: 18 mmol/L — ABNORMAL LOW (ref 22–32)
Calcium: 9.5 mg/dL (ref 8.9–10.3)
Chloride: 100 mmol/L (ref 98–111)
Creatinine, Ser: 0.41 mg/dL — ABNORMAL LOW (ref 0.44–1.00)
GFR, Estimated: >60 mL/min (ref >60)
Glucose, Bld: 93 mg/dL (ref 70–99)
Potassium: 3.8 mmol/L (ref 3.5–5.1)
Sodium: 137 mmol/L (ref 135–145)

## 2023-04-23 LAB — TROPONIN I (HIGH SENSITIVITY)
Troponin I (High Sensitivity): 5 ng/L (ref <18)
Troponin I (High Sensitivity): 5 ng/L (ref <18)

## 2023-04-23 LAB — HCG, SERUM, QUALITATIVE: Preg, Serum: NEGATIVE

## 2023-04-23 MED ORDER — LORAZEPAM 2 MG/ML IJ SOLN
1.0000 mg | Freq: Once | INTRAMUSCULAR | Status: AC
  Administered 2023-04-23: 1 mg via INTRAVENOUS
  Filled 2023-04-23: qty 1

## 2023-04-23 NOTE — Discharge Instructions (Addendum)
You are seen here today for your chest pain and your left-sided pain.  Your workup was very reassuring today.  There is no evidence of stroke on your brain imaging.  Please follow-up with the neurologist listed below if the numbness in your left side does not improve.  Return to the ER if you develop any new severe symptoms.

## 2023-04-23 NOTE — ED Provider Notes (Signed)
[place] EMERGENCY DEPARTMENT AT [facility] Provider Note   CSN: [id number] Arrival date & time: 04/22/23  2304     History  Chief Complaint  Patient presents with   Chest Pain    Kristi Kelley is a 47 y.o. female who presents because she reports a episode where her Chest became tight and she had some left-sided chest pain this evening with bodyaches.  Additionally is endorsed associated left-sided paresthesias and subjective weakness.  Is not been dropping objects.  Family endorsed episode where she was briefly "out of it" to question of changes in speech.  Patient states she is feeling overall very well at this time though she does endorse some residual subjective mild decrease in sensation on the left side compared to the right.  No neck pain headache fevers chills blurry or double vision.  No nausea or vomiting.  Per family patient is back to her cognitive baseline.  I reviewed her medical records previously of MDD no medications daily.  HPI     Home Medications Prior to Admission medications   Medication Sig Start Date End Date Taking? Authorizing Provider  FLUoxetine (PROZAC) 20 MG capsule TAKE 1 CAPSULE(20 MG) BY MOUTH AT BEDTIME 05/17/21   Grayce Sessions, NP  hydrochlorothiazide (HYDRODIURIL) 25 MG tablet Take 1 tablet (25 mg total) by mouth daily. 11/23/20   Grayce Sessions, NP  loratadine (CLARITIN) 10 MG tablet Take 1 tablet (10 mg total) by mouth daily. 08/28/13   Charm Rings, NP  phenazopyridine (PYRIDIUM) 100 MG tablet Take 1 tablet (100 mg total) by mouth 3 (three) times daily as needed for pain. 03/22/23   Valentino Nose, NP  promethazine (PHENERGAN) 25 MG tablet Take 1 tablet (25 mg total) by mouth every 8 (eight) hours as needed for nausea or vomiting. 09/18/20   Tilden Fossa, MD  traZODone (DESYREL) 50 MG tablet Take 1 tablet (50 mg total) by mouth at bedtime and may repeat dose one time if needed. 08/28/13 04/18/19  Charm Rings,  NP      Allergies    Aspirin and Tylenol [acetaminophen]    Review of Systems   Review of Systems  Constitutional: Negative.   HENT: Negative.    Respiratory:  Positive for chest tightness.   Cardiovascular: Negative.   Gastrointestinal: Negative.   Neurological:  Positive for weakness and numbness.    Physical Exam Updated Vital Signs BP 127/71   Pulse 95   Temp 98.1 F (36.7 C) (Oral)   Resp 12   SpO2 99%  Physical Exam Vitals and nursing note reviewed.  Constitutional:      Appearance: She is not ill-appearing or toxic-appearing.  HENT:     Head: Normocephalic and atraumatic.     Mouth/Throat:     Mouth: Mucous membranes are moist.     Pharynx: No oropharyngeal exudate or posterior oropharyngeal erythema.  Eyes:     General:        Right eye: No discharge.        Left eye: No discharge.     Conjunctiva/sclera: Conjunctivae normal.  Cardiovascular:     Rate and Rhythm: Normal rate and regular rhythm.     Pulses: Normal pulses.     Heart sounds: Normal heart sounds. No murmur heard. Pulmonary:     Effort: Pulmonary effort is normal. No respiratory distress.     Breath sounds: Normal breath sounds. No wheezing or rales.  Chest:     Chest wall:  No mass, tenderness or edema.  Abdominal:     General: Bowel sounds are normal. There is no distension.     Palpations: Abdomen is soft.     Tenderness: There is no abdominal tenderness.  Musculoskeletal:        General: No deformity.     Cervical back: Neck supple.     Right lower leg: No tenderness. No edema.     Left lower leg: No tenderness. No edema.  Skin:    General: Skin is warm and dry.     Capillary Refill: Capillary refill takes less than 2 seconds.  Neurological:     General: No focal deficit present.     Mental Status: She is alert and oriented to person, place, and time. Mental status is at baseline.     GCS: GCS eye subscore is 4. GCS verbal subscore is 5. GCS motor subscore is 6.     Cranial Nerves:  Cranial nerves 2-12 are intact.     Motor: Motor function is intact. No pronator drift.     Coordination: Coordination is intact.     Gait: Gait is intact.     Comments: Subjective mild decrease sensation in the left upper and lower extremity compared to the right.  4-5 grip strength in the left hand, 5 out of 5 on the right.  Symmetric plantarflexion and dorsiflexion strength.  Psychiatric:        Mood and Affect: Mood normal.     ED Results / Procedures / Treatments   Labs (all labs ordered are listed, but only abnormal results are displayed) Labs Reviewed  BASIC METABOLIC PANEL - Abnormal; Notable for the following components:      Result Value   CO2 18 (*)    BUN <5 (*)    Creatinine, Ser 0.41 (*)    Anion gap 19 (*)    All other components within normal limits  CBC  HCG, SERUM, QUALITATIVE  URINALYSIS, ROUTINE W REFLEX MICROSCOPIC  TROPONIN I (HIGH SENSITIVITY)  TROPONIN I (HIGH SENSITIVITY)    EKG EKG Interpretation Date/Time:  Saturday April 22 2023 22:53:52 EDT Ventricular Rate:  93 PR Interval:  146 QRS Duration:  66 QT Interval:  362 QTC Calculation: 450 R Axis:   55  Text Interpretation: Normal sinus rhythm Low voltage QRS Cannot rule out Anterior infarct , age undetermined Abnormal ECG Interpretation limited secondary to artifact Confirmed by Zadie Rhine ([id number]) on 04/23/2023 1:37:03 AM  Radiology MR BRAIN WO CONTRAST  Result Date: 04/23/2023 CLINICAL DATA:  Left-sided weakness EXAM: MRI HEAD WITHOUT CONTRAST TECHNIQUE: Multiplanar, multiecho pulse sequences of the brain and surrounding structures were obtained without intravenous contrast. COMPARISON:  Head CT earlier today FINDINGS: Brain: No acute infarction, hemorrhage, hydrocephalus, extra-axial collection or mass lesion. No white matter disease or atrophy Vascular: Normal flow voids. Skull and upper cervical spine: Normal marrow signal. Sinuses/Orbits: Negative. Other: Motion artifact affects later  sequences. IMPRESSION: Negative motion degraded brain MRI.  No explanation for weakness. Electronically Signed   By: Tiburcio Pea M.D.   On: 04/23/2023 06:25   CT HEAD WO CONTRAST ( )  Result Date: 04/23/2023 CLINICAL DATA:  Chest pain and left-sided body aches, stroke suspected EXAM: CT HEAD WITHOUT CONTRAST TECHNIQUE: Contiguous axial images were obtained from the base of the skull through the vertex without intravenous contrast. RADIATION DOSE REDUCTION: This exam was performed according to the departmental dose-optimization program which includes automated exposure control, adjustment of the mA and/or kV according to patient  size and/or use of iterative reconstruction technique. COMPARISON:  09/19/2020 FINDINGS: Brain: No evidence of acute infarction, hemorrhage, mass, mass effect, or midline shift. No hydrocephalus or extra-axial fluid collection. Vascular: No hyperdense vessel. Skull: Negative for fracture or focal lesion. Sinuses/Orbits: Mild mucosal thickening in the paranasal sinuses. No acute finding in the orbits. Other: The mastoid air cells are well aerated. IMPRESSION: No acute intracranial process. Electronically Signed   By: Wiliam Ke M.D.   On: 04/23/2023 03:16   DG Chest 2 View  Result Date: 04/23/2023 CLINICAL DATA:  Chest pain and left-sided body aches onset earlier this evening. EXAM: CHEST - 2 VIEW COMPARISON:  PA chest with rib series 10/12/2018 FINDINGS: The heart size and mediastinal contours are within normal limits. Both lungs are clear. The visualized skeletal structures are unremarkable apart from mild chronic thoracic levoscoliosis. There is artifact from overlying clothing. IMPRESSION: No active cardiopulmonary disease. Electronically Signed   By: Kristi Bar M.D.   On: 04/23/2023 00:30    Procedures Procedures    Medications Ordered in ED Medications  LORazepam (ATIVAN) injection 1 mg (1 mg Intravenous Given 04/23/23 0542)    ED Course/ Medical Decision  Making/ A&P                                Medical Decision Making 47 year old female presents with concern for episode of brief chest pain and left-sided weakness and numbness.  Hypertensive on intake and vital signs normal.  Cardiopulmonary abdominal send benign.  Neurologic exam with subjective decrease in sensation on the left side in the upper and lower extremities.  Mild decrease in grip strength on the left compared to the right.  Otherwise nonfocal neurologic exam.  Patient ambulatory in ED without difficulty.  No chest pain at this time.  CV  Amount and/or Complexity of Data Reviewed Labs: ordered.    Details: CBC without leukocytosis or anemia, BMP unremarkable with exception of mildly elevated anion gap to 19.  Patient is not pregnant troponin is negative, 5 x 2. Patient unable to provide urine sample in ED.   Radiology: ordered.    Details:   Chest x-ray negative for acute cardiopulmonary disease.  CT head negative for acute cranial normality.  MRI brain without evidence of acute process to explain patient's reported weakness.   Risk Prescription drug management.   Case discussed with Dr. Jerrell Belfast neurologist to states that he has reviewed patient's record and she is exceptionally low risk for CVA.  Given patient is without neck pain or recent injury no further workup is warranted needed this time.  He does recommend close outpatient follow-up with neurology and strict turn precautions.  Clinical concern for emergent underlying etiology that would warrant further ED workup inpatient management is exceedingly low.  Raja  voiced understanding of her medical evaluation and treatment plan. Each of their questions answered to their expressed satisfaction.  Return precautions were given.  Patient is well-appearing, stable, and was discharged in good condition.  This chart was dictated using voice recognition software, Dragon. Despite the best efforts of this provider to proofread and  correct errors, errors may still occur which can change documentation meaning.  Final Clinical Impression(s) / ED Diagnoses Final diagnoses:  Paresthesias    Rx / DC Orders ED Discharge Orders     None         Sherrilee Gilles 04/23/23 0745    Zadie Rhine, MD  04/24/23 0129  

## 2023-04-30 DIAGNOSIS — Z419 Encounter for procedure for purposes other than remedying health state, unspecified: Secondary | ICD-10-CM | POA: Diagnosis not present

## 2023-06-26 ENCOUNTER — Ambulatory Visit (INDEPENDENT_AMBULATORY_CARE_PROVIDER_SITE_OTHER): Payer: 59 | Admitting: Primary Care

## 2023-06-30 DIAGNOSIS — Z419 Encounter for procedure for purposes other than remedying health state, unspecified: Secondary | ICD-10-CM | POA: Diagnosis not present

## 2023-07-30 DIAGNOSIS — Z419 Encounter for procedure for purposes other than remedying health state, unspecified: Secondary | ICD-10-CM | POA: Diagnosis not present

## 2023-08-30 DIAGNOSIS — Z419 Encounter for procedure for purposes other than remedying health state, unspecified: Secondary | ICD-10-CM | POA: Diagnosis not present

## 2023-09-30 DIAGNOSIS — Z419 Encounter for procedure for purposes other than remedying health state, unspecified: Secondary | ICD-10-CM | POA: Diagnosis not present

## 2023-10-28 DIAGNOSIS — Z419 Encounter for procedure for purposes other than remedying health state, unspecified: Secondary | ICD-10-CM | POA: Diagnosis not present

## 2023-11-07 ENCOUNTER — Ambulatory Visit (INDEPENDENT_AMBULATORY_CARE_PROVIDER_SITE_OTHER): Admitting: Primary Care

## 2023-11-07 ENCOUNTER — Encounter (INDEPENDENT_AMBULATORY_CARE_PROVIDER_SITE_OTHER): Payer: Self-pay | Admitting: Primary Care

## 2023-11-07 VITALS — BP 99/65 | HR 93 | Wt 151.8 lb

## 2023-11-07 DIAGNOSIS — Z1211 Encounter for screening for malignant neoplasm of colon: Secondary | ICD-10-CM

## 2023-11-07 DIAGNOSIS — Z111 Encounter for screening for respiratory tuberculosis: Secondary | ICD-10-CM

## 2023-11-07 DIAGNOSIS — Z7689 Persons encountering health services in other specified circumstances: Secondary | ICD-10-CM

## 2023-11-07 NOTE — Progress Notes (Unsigned)
 Subjective:   Kristi Kelley is a 48 y.o. female to re establish care.  Patient is getting a TB test for work. Past Medical History:  Diagnosis Date   Medical history non-contributory    No pertinent past medical history    Scoliosis      Allergies  Allergen Reactions   Aspirin Hives   Tylenol [Acetaminophen] Hives    Current Outpatient Medications on File Prior to Visit  Medication Sig Dispense Refill   FLUoxetine (PROZAC) 20 MG capsule TAKE 1 CAPSULE(20 MG) BY MOUTH AT BEDTIME 90 capsule 0   hydrochlorothiazide (HYDRODIURIL) 25 MG tablet Take 1 tablet (25 mg total) by mouth daily. 90 tablet 3   loratadine (CLARITIN) 10 MG tablet Take 1 tablet (10 mg total) by mouth daily. 30 tablet 0   phenazopyridine (PYRIDIUM) 100 MG tablet Take 1 tablet (100 mg total) by mouth 3 (three) times daily as needed for pain. 10 tablet 0   promethazine (PHENERGAN) 25 MG tablet Take 1 tablet (25 mg total) by mouth every 8 (eight) hours as needed for nausea or vomiting. 12 tablet 0   [DISCONTINUED] traZODone (DESYREL) 50 MG tablet Take 1 tablet (50 mg total) by mouth at bedtime and may repeat dose one time if needed. 30 tablet 0   No current facility-administered medications on file prior to visit.    Review of System: ROS Comprehensive ROS Pertinent positive and negative noted in HPI   Objective:  BP 99/65 (BP Location: Right Arm, Patient Position: Sitting, Cuff Size: Normal)   Pulse 93   Wt 151 lb 12.8 oz (68.9 kg)   SpO2 94%   BMI 28.68 kg/m   Filed Weights   11/07/23 1508  Weight: 151 lb 12.8 oz (68.9 kg)   Physical Exam Vitals reviewed.  Constitutional:      Appearance: Normal appearance.  HENT:     Head: Normocephalic.     Right Ear: Tympanic membrane, ear canal and external ear normal.     Left Ear: Tympanic membrane, ear canal and external ear normal.     Nose: Nose normal.     Mouth/Throat:     Mouth: Mucous membranes are moist.  Eyes:     Extraocular Movements:  Extraocular movements intact.     Pupils: Pupils are equal, round, and reactive to light.  Cardiovascular:     Rate and Rhythm: Normal rate.  Pulmonary:     Effort: Pulmonary effort is normal.     Breath sounds: Normal breath sounds.  Abdominal:     General: Bowel sounds are normal.     Palpations: Abdomen is soft.  Musculoskeletal:        General: Normal range of motion.     Cervical back: Normal range of motion.  Skin:    General: Skin is warm and dry.  Neurological:     Mental Status: She is alert and oriented to person, place, and time.  Psychiatric:        Mood and Affect: Mood normal.        Behavior: Behavior normal.        Thought Content: Thought content normal.      Assessment:  Kristi Kelley was seen today for hospitalization follow-up.  Diagnoses and all orders for this visit:  Encounter to establish care  Screening-pulmonary TB -     TB Skin Test  Colon cancer screening -     Ambulatory referral to Gastroenterology      This note has been created  with Education officer, environmental. Any transcriptional errors are unintentional.   Return for fasting labs.  Grayce Sessions, NP 11/10/2023, 11:15 AM

## 2023-11-09 ENCOUNTER — Ambulatory Visit (INDEPENDENT_AMBULATORY_CARE_PROVIDER_SITE_OTHER): Admitting: Primary Care

## 2023-11-09 DIAGNOSIS — Z111 Encounter for screening for respiratory tuberculosis: Secondary | ICD-10-CM

## 2023-11-09 LAB — TB SKIN TEST: TB Skin Test: POSITIVE

## 2023-11-14 ENCOUNTER — Telehealth (INDEPENDENT_AMBULATORY_CARE_PROVIDER_SITE_OTHER): Payer: Self-pay

## 2023-11-14 LAB — QUANTIFERON-TB GOLD PLUS
QuantiFERON Mitogen Value: >10 [IU]/mL
QuantiFERON Nil Value: 0.64 [IU]/mL
QuantiFERON TB1 Ag Value: 8.53 [IU]/mL
QuantiFERON TB2 Ag Value: 6.75 [IU]/mL
QuantiFERON-TB Gold Plus: POSITIVE — AB

## 2023-11-14 NOTE — Telephone Encounter (Signed)
 Results were completed this morning 11/14/23 at 5:35am. Will forward to provider to results lab. Currently provider is out sick but is managing basket. Reached out to provider to see if she is able to results pt lab.   Per provider

## 2023-11-14 NOTE — Telephone Encounter (Signed)
 Copied from CRM [id number]. Topic: General - Other >> Nov 13, 2023  3:45 PM Emylou G wrote: Reason for CRM: patient calling back checking on labs.. pls call her [phone]

## 2023-11-15 ENCOUNTER — Other Ambulatory Visit (INDEPENDENT_AMBULATORY_CARE_PROVIDER_SITE_OTHER): Payer: Self-pay

## 2023-11-15 DIAGNOSIS — R7612 Nonspecific reaction to cell mediated immunity measurement of gamma interferon antigen response without active tuberculosis: Secondary | ICD-10-CM

## 2023-11-15 NOTE — Telephone Encounter (Signed)
 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

## 2023-11-16 ENCOUNTER — Ambulatory Visit: Payer: Self-pay

## 2023-11-16 NOTE — Telephone Encounter (Signed)
 Attempted to call patient, call cannot be completed as dialed.   Message from [place] P sent at 11/16/2023  4:37 PM EDT  Pt called to go over TB text and needs a note for work regarding results

## 2023-11-16 NOTE — Telephone Encounter (Signed)
 3rd attempt to return call to patient is unsuccessful, received auto-recorded message that call cannot be completed.   Message from [place] P sent at 11/16/2023  4:37 PM EDT  Pt called to go over TB text and needs a note for work regarding results   Reason for Disposition  Third attempt to contact caller AND no contact made. Phone number verified.    Received auto-recording that call cannot be completed as dialed  Protocols used: No Contact or Duplicate Contact Call-A-AH

## 2023-11-17 ENCOUNTER — Telehealth: Payer: Self-pay | Admitting: *Deleted

## 2023-11-17 ENCOUNTER — Other Ambulatory Visit (INDEPENDENT_AMBULATORY_CARE_PROVIDER_SITE_OTHER): Payer: Self-pay

## 2023-11-17 DIAGNOSIS — R7612 Nonspecific reaction to cell mediated immunity measurement of gamma interferon antigen response without active tuberculosis: Secondary | ICD-10-CM

## 2023-11-17 NOTE — Telephone Encounter (Signed)
 Returned pt call. Pt asked when she can get xray done made pt aware that she can go to [facility] and she will need a mask and she will need to ask for the radiology department. Pt states she will go today and pt is requesting a note for work. Pt states the letter needs to say that she tested positive for TB and will need to remain out of work till further testing

## 2023-11-17 NOTE — Telephone Encounter (Signed)
 Patient has called for test results. The office is closed and so patient has been informed of her results and provider recommendation:   Spoke with [place] about results positive TB gave v.o to place referral to ID. Also, made nurse aware will inform Dr. Anne Hahn ( ID) and pulmonologist Dr. Maple Hudson for appropriate orders. VO given for ID referral and chest chest x-ray per ID request.   Patient states she needs note for work with results and recommendations. Patient has to have negative results to go back to work. Patient will await contact from office: [phone] - best contact- may leave message- alternate number- [phone]

## 2023-11-17 NOTE — Progress Notes (Signed)
 Late entry- 11/07/2023- Patient came in for a TB skin test reading. Showed a positive reading to the left forearm. Unable to measure. Quanterferon labs for TB were collected.

## 2023-11-18 ENCOUNTER — Other Ambulatory Visit: Payer: Self-pay

## 2023-11-18 ENCOUNTER — Emergency Department (HOSPITAL_COMMUNITY)

## 2023-11-18 ENCOUNTER — Emergency Department (HOSPITAL_COMMUNITY)
Admission: EM | Admit: 2023-11-18 | Discharge: 2023-11-19 | Discharge status: Against Medical Advice | Attending: Emergency Medicine | Admitting: Emergency Medicine

## 2023-11-18 ENCOUNTER — Encounter (HOSPITAL_COMMUNITY): Payer: Self-pay | Admitting: Emergency Medicine

## 2023-11-18 DIAGNOSIS — R7612 Nonspecific reaction to cell mediated immunity measurement of gamma interferon antigen response without active tuberculosis: Secondary | ICD-10-CM | POA: Diagnosis not present

## 2023-11-18 DIAGNOSIS — R21 Rash and other nonspecific skin eruption: Secondary | ICD-10-CM | POA: Diagnosis present

## 2023-11-18 DIAGNOSIS — Z79899 Other long term (current) drug therapy: Secondary | ICD-10-CM | POA: Diagnosis not present

## 2023-11-18 DIAGNOSIS — Z5321 Procedure and treatment not carried out due to patient leaving prior to being seen by health care provider: Secondary | ICD-10-CM | POA: Diagnosis not present

## 2023-11-18 DIAGNOSIS — Z111 Encounter for screening for respiratory tuberculosis: Secondary | ICD-10-CM | POA: Diagnosis not present

## 2023-11-18 DIAGNOSIS — T7840XA Allergy, unspecified, initial encounter: Secondary | ICD-10-CM | POA: Insufficient documentation

## 2023-11-18 DIAGNOSIS — R7611 Nonspecific reaction to tuberculin skin test without active tuberculosis: Secondary | ICD-10-CM | POA: Diagnosis not present

## 2023-11-18 NOTE — ED Triage Notes (Signed)
 Pt in with +PPD test on 3/11, 3/12 pt states she had a "full body allergic reaction" that broke her out in an itchy rash - was never treated for this, it just resolved on it's own. Pt states per work, she needs a CXR before returning to pt care. Pt denies any other complaints at this time.

## 2023-11-19 ENCOUNTER — Emergency Department (HOSPITAL_COMMUNITY)
Admission: EM | Admit: 2023-11-19 | Discharge: 2023-11-19 | Disposition: A | Discharge status: Home / Self Care | Attending: Emergency Medicine | Admitting: Emergency Medicine

## 2023-11-19 ENCOUNTER — Emergency Department (HOSPITAL_COMMUNITY)

## 2023-11-19 ENCOUNTER — Encounter (HOSPITAL_COMMUNITY): Payer: Self-pay | Admitting: *Deleted

## 2023-11-19 DIAGNOSIS — R7612 Nonspecific reaction to cell mediated immunity measurement of gamma interferon antigen response without active tuberculosis: Secondary | ICD-10-CM | POA: Insufficient documentation

## 2023-11-19 DIAGNOSIS — Z79899 Other long term (current) drug therapy: Secondary | ICD-10-CM | POA: Insufficient documentation

## 2023-11-19 DIAGNOSIS — Z111 Encounter for screening for respiratory tuberculosis: Secondary | ICD-10-CM | POA: Diagnosis not present

## 2023-11-19 DIAGNOSIS — R7611 Nonspecific reaction to tuberculin skin test without active tuberculosis: Secondary | ICD-10-CM | POA: Diagnosis not present

## 2023-11-19 NOTE — ED Provider Notes (Signed)
 [place] EMERGENCY DEPARTMENT AT [facility] Provider Note   CSN: [id number] Arrival date & time: 11/19/23  2014     History  Request for chest x-ray.  Kristi Kelley is a 48 y.o. female.  HPI   Patient came to the ED to get a chest x-ray.  Patient had a positive TB test.  Patient works in healthcare.  Patient was instructed that she needed to have a chest x-ray to make sure she did not have any signs of active TB.  Patient was instructed to go to [facility] to have a chest x-ray.  Does not appear the patient was informed she needed to go to the radiology department so she came to the emergency room.  Patient denies any acute complaints.  Home Medications Prior to Admission medications   Medication Sig Start Date End Date Taking? Authorizing Provider  FLUoxetine (PROZAC) 20 MG capsule TAKE 1 CAPSULE(20 MG) BY MOUTH AT BEDTIME 05/17/21   Grayce Sessions, NP  hydrochlorothiazide (HYDRODIURIL) 25 MG tablet Take 1 tablet (25 mg total) by mouth daily. 11/23/20   Grayce Sessions, NP  loratadine (CLARITIN) 10 MG tablet Take 1 tablet (10 mg total) by mouth daily. 08/28/13   Charm Rings, NP  phenazopyridine (PYRIDIUM) 100 MG tablet Take 1 tablet (100 mg total) by mouth 3 (three) times daily as needed for pain. 03/22/23   Valentino Nose, NP  promethazine (PHENERGAN) 25 MG tablet Take 1 tablet (25 mg total) by mouth every 8 (eight) hours as needed for nausea or vomiting. 09/18/20   Tilden Fossa, MD  traZODone (DESYREL) 50 MG tablet Take 1 tablet (50 mg total) by mouth at bedtime and may repeat dose one time if needed. 08/28/13 04/18/19  Charm Rings, NP      Allergies    Aspirin and Tylenol [acetaminophen]    Review of Systems   Review of Systems  Physical Exam Updated Vital Signs BP (!) 126/92 (BP Location: Right Arm)   Pulse 97   Temp 98.1 F (36.7 C)   Resp 18   Ht 1.549 m (5\' 1" )   Wt 68.9 kg   LMP 10/25/2023 (Exact Date)   SpO2 99%    BMI 28.70 kg/m  Physical Exam Vitals and nursing note reviewed.  Constitutional:      General: She is not in acute distress.    Appearance: She is well-developed.  HENT:     Head: Normocephalic and atraumatic.     Right Ear: External ear normal.     Left Ear: External ear normal.  Eyes:     General: No scleral icterus.       Right eye: No discharge.        Left eye: No discharge.     Conjunctiva/sclera: Conjunctivae normal.  Neck:     Trachea: No tracheal deviation.  Cardiovascular:     Rate and Rhythm: Normal rate.  Pulmonary:     Effort: Pulmonary effort is normal. No respiratory distress.     Breath sounds: No stridor.  Abdominal:     General: There is no distension.  Musculoskeletal:        General: No swelling or deformity.     Cervical back: Neck supple.  Skin:    General: Skin is warm and dry.     Findings: No rash.  Neurological:     Mental Status: She is alert. Mental status is at baseline.     Cranial Nerves: No dysarthria  or facial asymmetry.     Motor: No seizure activity.     ED Results / Procedures / Treatments   Labs (all labs ordered are listed, but only abnormal results are displayed) Labs Reviewed - No data to display  EKG None  Radiology No results found.  Procedures Procedures    Medications Ordered in ED Medications - No data to display  ED Course/ Medical Decision Making/ A&P                                 Medical Decision Making Amount and/or Complexity of Data Reviewed Radiology: ordered.   Patient has a positive TB test.  She is not having any symptoms of active TB.  No fevers no cough no weight loss.  Will obtain a chest x-ray.  Patient does not want to wait for the results.  Her primary doctor had already ordered a chest x-ray.  Explained to her the results will be available in MyChart.        Final Clinical Impression(s) / ED Diagnoses Final diagnoses:  Positive QuantiFERON-TB Gold test    Rx / DC Orders ED  Discharge Orders     None         Linwood Dibbles, MD 11/19/23 2310

## 2023-11-19 NOTE — ED Triage Notes (Signed)
 The pt had a tb test and had an allergic reaction march 11  then she had a blood tb test march the 15  she is requesting a chest xray to r/o tb  lmp feb 17th

## 2023-11-19 NOTE — Discharge Instructions (Signed)
 Follow-up with your doctor to review the chest x-ray results

## 2023-11-19 NOTE — ED Notes (Signed)
Pt called X3 to go back to a room. Pt could not be found.  

## 2023-11-20 ENCOUNTER — Encounter (INDEPENDENT_AMBULATORY_CARE_PROVIDER_SITE_OTHER): Payer: Self-pay | Admitting: Primary Care

## 2023-11-20 ENCOUNTER — Telehealth (INDEPENDENT_AMBULATORY_CARE_PROVIDER_SITE_OTHER): Payer: Self-pay | Admitting: Primary Care

## 2023-11-20 NOTE — Telephone Encounter (Signed)
 Pt came in stating that she needs a work note saying that she needs a negative test for TB. Please advise and pt is in lobby waiting.

## 2023-11-22 NOTE — Telephone Encounter (Signed)
 Done

## 2023-11-23 ENCOUNTER — Encounter (INDEPENDENT_AMBULATORY_CARE_PROVIDER_SITE_OTHER): Payer: Self-pay | Admitting: Primary Care

## 2023-11-23 ENCOUNTER — Other Ambulatory Visit (HOSPITAL_COMMUNITY)
Admission: RE | Admit: 2023-11-23 | Discharge: 2023-11-23 | Disposition: A | Discharge status: Home / Self Care | Source: Ambulatory Visit | Attending: Primary Care | Admitting: Primary Care

## 2023-11-23 ENCOUNTER — Ambulatory Visit (INDEPENDENT_AMBULATORY_CARE_PROVIDER_SITE_OTHER): Admitting: Primary Care

## 2023-11-23 VITALS — HR 79 | Wt 149.2 lb

## 2023-11-23 DIAGNOSIS — Z124 Encounter for screening for malignant neoplasm of cervix: Secondary | ICD-10-CM

## 2023-11-23 DIAGNOSIS — R7612 Nonspecific reaction to cell mediated immunity measurement of gamma interferon antigen response without active tuberculosis: Secondary | ICD-10-CM | POA: Diagnosis not present

## 2023-11-23 NOTE — Progress Notes (Signed)
  [facility]  WELL-WOMAN PHYS[facility] Patient name: Kristi Kelley MRN [id number]  Date of birth: 1976-03-08 Chief Complaint:   Gynecologic Exam  History of Present Illness:   Kristi Kelley is a 48 y.o. [phone] female being seen today for a routine well-woman exam.   CC:GYN The current method of family planning is none.  Patient's last menstrual period was 10/25/2023 (exact date). Last mammogram: Family h/o breast cancer: No Last colonoscopy:  Family h/o colorectal cancer: No  Health Maintenance  Topic Date Due   Hepatitis C Screening  Never done   Pap with HPV screening  Never done   DTaP/Tdap/Td vaccine (2 - Td or Tdap) 06/07/2021   Colon Cancer Screening  Never done   Flu Shot  03/30/2023   COVID-19 Vaccine (1 - 2024-25 season) Never done   HIV Screening  Completed   HPV Vaccine  Aged Out   Review of Systems:    Denies any headaches, blurred vision, fatigue, shortness of breath, chest pain, abdominal pain, abnormal vaginal discharge/itching/odor/irritation, problems with periods, bowel movements, urination, or intercourse unless otherwise stated above.  Pertinent History Reviewed:   Reviewed past medical,surgical, social and family history.  Reviewed problem list, medications and allergies.  Physical Assessment:   Vitals:   11/23/23 1054  Pulse: 79  SpO2: 99%  Weight: 149 lb 3.2 oz (67.7 kg)  Body mass index is 28.19 kg/m.        Physical Examination:  General appearance - well appearing, and in no distress Mental status - alert, oriented to person, place, and time Psych:  She has a normal mood and affect Skin - warm and dry, normal color, no suspicious lesions noted Chest - effort normal, all lung fields clear to auscultation bilaterally Heart - normal rate and regular rhythm Neck:  midline trachea, no thyromegaly or nodules Breasts - breasts appear normal, no suspicious masses, no skin or nipple changes or axillary nodes Educated  patient on proper self breast examination and had patient to demonstrate SBE. Abdomen - soft, nontender, nondistended, no masses or organomegaly Pelvic-VULVA: normal appearing vulva with no masses, tenderness or lesions   VAGINA: normal appearing vagina with normal color and discharge, no lesions   CERVIX: normal appearing cervix without discharge or lesions, no CMT UTERUS: uterus is felt to be normal size, shape, consistency and nontender  ADNEXA: No adnexal masses or tenderness noted. Extremities:  No swelling or varicosities noted  No results found for this or any previous visit (from the past 24 hours).   Assessment & Plan:  [name] was seen today for gynecologic exam.  Diagnoses and all orders for this visit:  Cervical cancer screening -     Cervicovaginal ancillary only -     Cytology - PAP([place]) -     HIV antibody (with reflex)  Positive QuantiFERON-TB Gold test -     Ambulatory referral to Infectious Disease    Orders Placed This Encounter  Procedures   HIV antibody (with reflex)   Ambulatory referral to Infectious Disease    Meds: No orders of the defined types were placed in this encounter.   Follow-up: prn  This note has been created with Education officer, environmental. Any transcriptional errors are unintentional.   Grayce Sessions, NP 11/23/2023, 3:21 PM

## 2023-11-27 LAB — CERVICOVAGINAL ANCILLARY ONLY
Bacterial Vaginitis (gardnerella): POSITIVE — AB
Candida Glabrata: NEGATIVE
Candida Vaginitis: NEGATIVE
Chlamydia: NEGATIVE
Comment: NEGATIVE
Comment: NEGATIVE
Comment: NEGATIVE
Comment: NEGATIVE
Comment: NEGATIVE
Comment: NORMAL
Neisseria Gonorrhea: NEGATIVE
Trichomonas: POSITIVE — AB

## 2023-11-28 LAB — CYTOLOGY - PAP: Diagnosis: NEGATIVE

## 2023-11-29 ENCOUNTER — Other Ambulatory Visit (INDEPENDENT_AMBULATORY_CARE_PROVIDER_SITE_OTHER): Payer: Self-pay | Admitting: Primary Care

## 2023-11-29 DIAGNOSIS — A599 Trichomoniasis, unspecified: Secondary | ICD-10-CM

## 2023-11-29 MED ORDER — METRONIDAZOLE 500 MG PO TABS
500.0000 mg | ORAL_TABLET | Freq: Two times a day (BID) | ORAL | 0 refills | Status: AC
Start: 2023-11-29 — End: ?

## 2023-12-09 DIAGNOSIS — Z419 Encounter for procedure for purposes other than remedying health state, unspecified: Secondary | ICD-10-CM | POA: Diagnosis not present

## 2024-01-08 DIAGNOSIS — Z419 Encounter for procedure for purposes other than remedying health state, unspecified: Secondary | ICD-10-CM | POA: Diagnosis not present

## 2024-02-08 DIAGNOSIS — Z419 Encounter for procedure for purposes other than remedying health state, unspecified: Secondary | ICD-10-CM | POA: Diagnosis not present

## 2024-02-22 ENCOUNTER — Telehealth (INDEPENDENT_AMBULATORY_CARE_PROVIDER_SITE_OTHER): Payer: Self-pay | Admitting: Primary Care

## 2024-02-22 NOTE — Telephone Encounter (Signed)
 Called pt to confirm appt. Pt will be present.

## 2024-02-23 ENCOUNTER — Ambulatory Visit (INDEPENDENT_AMBULATORY_CARE_PROVIDER_SITE_OTHER): Admitting: Primary Care

## 2024-03-02 ENCOUNTER — Emergency Department (HOSPITAL_COMMUNITY)
Admission: EM | Admit: 2024-03-02 | Discharge: 2024-03-03 | Disposition: A | Discharge status: Home / Self Care | Attending: Emergency Medicine | Admitting: Emergency Medicine

## 2024-03-02 ENCOUNTER — Other Ambulatory Visit: Payer: Self-pay

## 2024-03-02 DIAGNOSIS — K5792 Diverticulitis of intestine, part unspecified, without perforation or abscess without bleeding: Secondary | ICD-10-CM | POA: Diagnosis not present

## 2024-03-02 DIAGNOSIS — K5900 Constipation, unspecified: Secondary | ICD-10-CM | POA: Diagnosis not present

## 2024-03-02 DIAGNOSIS — N3001 Acute cystitis with hematuria: Secondary | ICD-10-CM | POA: Diagnosis not present

## 2024-03-02 DIAGNOSIS — N83201 Unspecified ovarian cyst, right side: Secondary | ICD-10-CM | POA: Diagnosis not present

## 2024-03-02 DIAGNOSIS — R9431 Abnormal electrocardiogram [ECG] [EKG]: Secondary | ICD-10-CM | POA: Diagnosis not present

## 2024-03-02 DIAGNOSIS — R1111 Vomiting without nausea: Secondary | ICD-10-CM | POA: Diagnosis not present

## 2024-03-02 DIAGNOSIS — R1084 Generalized abdominal pain: Secondary | ICD-10-CM | POA: Diagnosis not present

## 2024-03-02 DIAGNOSIS — N83202 Unspecified ovarian cyst, left side: Secondary | ICD-10-CM | POA: Diagnosis not present

## 2024-03-02 DIAGNOSIS — K5730 Diverticulosis of large intestine without perforation or abscess without bleeding: Secondary | ICD-10-CM | POA: Diagnosis not present

## 2024-03-02 DIAGNOSIS — Z87891 Personal history of nicotine dependence: Secondary | ICD-10-CM | POA: Diagnosis not present

## 2024-03-02 NOTE — ED Triage Notes (Addendum)
 Patient BIB [facility] from home for lower abdominal pain x 1 week with constipation, had liquid BM 3 days ago with no relief. Also vomiting, unable to tolerate food and water except in very small amounts. No hx of abdominal surgeries. BP 124/74 HR 66 CBG 92 RR 16

## 2024-03-02 NOTE — ED Notes (Signed)
 RN attempted IV placement, unable to obtain IV access but able to obtain blood, report given to receiving RN.

## 2024-03-03 ENCOUNTER — Emergency Department (HOSPITAL_COMMUNITY)

## 2024-03-03 DIAGNOSIS — N83201 Unspecified ovarian cyst, right side: Secondary | ICD-10-CM | POA: Diagnosis not present

## 2024-03-03 DIAGNOSIS — K5730 Diverticulosis of large intestine without perforation or abscess without bleeding: Secondary | ICD-10-CM | POA: Diagnosis not present

## 2024-03-03 DIAGNOSIS — N83202 Unspecified ovarian cyst, left side: Secondary | ICD-10-CM | POA: Diagnosis not present

## 2024-03-03 DIAGNOSIS — K5900 Constipation, unspecified: Secondary | ICD-10-CM | POA: Diagnosis not present

## 2024-03-03 LAB — COMPREHENSIVE METABOLIC PANEL WITH GFR
ALT: 61 U/L — ABNORMAL HIGH (ref 0–44)
AST: 65 U/L — ABNORMAL HIGH (ref 15–41)
Albumin: 3 g/dL — ABNORMAL LOW (ref 3.5–5.0)
Alkaline Phosphatase: 112 U/L (ref 38–126)
Anion gap: 17 — ABNORMAL HIGH (ref 5–15)
BUN: 9 mg/dL (ref 6–20)
CO2: 17 mmol/L — ABNORMAL LOW (ref 22–32)
Calcium: 9.5 mg/dL (ref 8.9–10.3)
Chloride: 100 mmol/L (ref 98–111)
Creatinine, Ser: 0.67 mg/dL (ref 0.44–1.00)
GFR, Estimated: >60 mL/min (ref >60)
Glucose, Bld: 101 mg/dL — ABNORMAL HIGH (ref 70–99)
Potassium: 2.9 mmol/L — ABNORMAL LOW (ref 3.5–5.1)
Sodium: 134 mmol/L — ABNORMAL LOW (ref 135–145)
Total Bilirubin: 0.8 mg/dL (ref 0.0–1.2)
Total Protein: 8 g/dL (ref 6.5–8.1)

## 2024-03-03 LAB — URINALYSIS, ROUTINE W REFLEX MICROSCOPIC
Bilirubin Urine: NEGATIVE
Glucose, UA: NEGATIVE mg/dL
Ketones, ur: 5 mg/dL — AB
Nitrite: POSITIVE — AB
Protein, ur: 30 mg/dL — AB
Specific Gravity, Urine: 1.027 (ref 1.005–1.030)
pH: 6 (ref 5.0–8.0)

## 2024-03-03 LAB — I-STAT CHEM 8, ED
BUN: 8 mg/dL (ref 6–20)
Calcium, Ion: 1.03 mmol/L — ABNORMAL LOW (ref 1.15–1.40)
Chloride: 103 mmol/L (ref 98–111)
Creatinine, Ser: 0.4 mg/dL — ABNORMAL LOW (ref 0.44–1.00)
Glucose, Bld: 99 mg/dL (ref 70–99)
HCT: 40 % (ref 36.0–46.0)
Hemoglobin: 13.6 g/dL (ref 12.0–15.0)
Potassium: 2.9 mmol/L — ABNORMAL LOW (ref 3.5–5.1)
Sodium: 134 mmol/L — ABNORMAL LOW (ref 135–145)
TCO2: 22 mmol/L (ref 22–32)

## 2024-03-03 LAB — CBC
HCT: 38 % (ref 36.0–46.0)
Hemoglobin: 13.1 g/dL (ref 12.0–15.0)
MCH: 29.6 pg (ref 26.0–34.0)
MCHC: 34.5 g/dL (ref 30.0–36.0)
MCV: 85.8 fL (ref 80.0–100.0)
Platelets: 297 K/uL (ref 150–400)
RBC: 4.43 MIL/uL (ref 3.87–5.11)
RDW: 13.6 % (ref 11.5–15.5)
WBC: 6.9 K/uL (ref 4.0–10.5)
nRBC: 0 % (ref 0.0–0.2)

## 2024-03-03 LAB — HCG, SERUM, QUALITATIVE: Preg, Serum: NEGATIVE

## 2024-03-03 LAB — LIPASE, BLOOD: Lipase: 26 U/L (ref 11–51)

## 2024-03-03 MED ORDER — ONDANSETRON HCL 4 MG/2ML IJ SOLN
4.0000 mg | Freq: Once | INTRAMUSCULAR | Status: AC
  Administered 2024-03-03: 4 mg via INTRAVENOUS
  Filled 2024-03-03: qty 2

## 2024-03-03 MED ORDER — SODIUM CHLORIDE 0.9 % IV BOLUS
1000.0000 mL | Freq: Once | INTRAVENOUS | Status: AC
  Administered 2024-03-03: 1000 mL via INTRAVENOUS

## 2024-03-03 MED ORDER — MORPHINE SULFATE (PF) 4 MG/ML IV SOLN
4.0000 mg | Freq: Once | INTRAVENOUS | Status: AC
  Administered 2024-03-03: 4 mg via INTRAVENOUS
  Filled 2024-03-03: qty 1

## 2024-03-03 MED ORDER — OXYCODONE HCL 5 MG PO TABS: 5.0000 mg | ORAL_TABLET | ORAL | 0 refills | Status: AC | PRN

## 2024-03-03 MED ORDER — CEPHALEXIN 500 MG PO CAPS: 500.0000 mg | ORAL_CAPSULE | Freq: Four times a day (QID) | ORAL | 0 refills | Status: AC

## 2024-03-03 MED ORDER — IOHEXOL 350 MG/ML SOLN
75.0000 mL | Freq: Once | INTRAVENOUS | Status: AC | PRN
  Administered 2024-03-03: 75 mL via INTRAVENOUS

## 2024-03-03 MED ORDER — HYDROMORPHONE HCL 1 MG/ML IJ SOLN
2.0000 mg | Freq: Once | INTRAMUSCULAR | Status: AC
  Administered 2024-03-03: 2 mg via INTRAVENOUS
  Filled 2024-03-03: qty 2

## 2024-03-03 MED ORDER — ONDANSETRON 4 MG PO TBDP: 4.0000 mg | ORAL_TABLET | Freq: Three times a day (TID) | ORAL | 0 refills | Status: AC | PRN

## 2024-03-03 NOTE — ED Provider Notes (Signed)
 [place] EMERGENCY DEPARTMENT AT [facility] Provider Note   CSN: [id number] Arrival date & time: 03/02/24  2245     Patient presents with: Emesis, Abdominal Pain, and Constipation   Kristi Kelley is a 48 y.o. female.  Patient presents the emergency room complaining of 1 week of generalized abdominal pain.  She states she has had constipation with a liquid bowel movement 3 days ago.  She states she has had no further bowel movement since that time.  She endorses vomiting for the past 2 to 3 days and states that she has been able to tolerate a very small amount of water but no food.  She denies any pertinent past medical history.  The patient denies chest pain, shortness of breath, fever, urinary symptoms.    Emesis Associated symptoms: abdominal pain   Abdominal Pain Associated symptoms: constipation and vomiting   Constipation Associated symptoms: abdominal pain and vomiting        Prior to Admission medications   Medication Sig Start Date End Date Taking? Authorizing Provider  cephALEXin  (KEFLEX ) 500 MG capsule Take 1 capsule (500 mg total) by mouth 4 (four) times daily. 03/03/24  Yes Logan Ubaldo NOVAK, PA-C  ondansetron  (ZOFRAN -ODT) 4 MG disintegrating tablet Take 1 tablet (4 mg total) by mouth every 8 (eight) hours as needed for nausea or vomiting. 03/03/24  Yes Logan Ubaldo NOVAK, PA-C  oxyCODONE  (ROXICODONE ) 5 MG immediate release tablet Take 1 tablet (5 mg total) by mouth every 4 (four) hours as needed for severe pain (pain score 7-10). 03/03/24  Yes Logan Ubaldo B, PA-C  FLUoxetine  (PROZAC ) 20 MG capsule TAKE 1 CAPSULE(20 MG) BY MOUTH AT BEDTIME 05/17/21   Celestia Rosaline SQUIBB, NP  hydrochlorothiazide  (HYDRODIURIL ) 25 MG tablet Take 1 tablet (25 mg total) by mouth daily. 11/23/20   Celestia Rosaline SQUIBB, NP  loratadine  (CLARITIN ) 10 MG tablet Take 1 tablet (10 mg total) by mouth daily. 08/28/13   Jacquetta Sharlot GRADE, NP  metroNIDAZOLE  (FLAGYL ) 500 MG tablet Take 1 tablet  (500 mg total) by mouth 2 (two) times daily. 11/29/23   Celestia Rosaline SQUIBB, NP  phenazopyridine  (PYRIDIUM ) 100 MG tablet Take 1 tablet (100 mg total) by mouth 3 (three) times daily as needed for pain. 03/22/23   Chandra Harlene LABOR, NP  promethazine  (PHENERGAN ) 25 MG tablet Take 1 tablet (25 mg total) by mouth every 8 (eight) hours as needed for nausea or vomiting. 09/18/20   Griselda Norris, MD  traZODone  (DESYREL ) 50 MG tablet Take 1 tablet (50 mg total) by mouth at bedtime and may repeat dose one time if needed. 08/28/13 04/18/19  Jacquetta Sharlot GRADE, NP    Allergies: Aspirin and Tylenol  Dannie.Crown ]    Review of Systems  Gastrointestinal:  Positive for abdominal pain, constipation and vomiting.    Updated Vital Signs BP 108/73   Pulse 77   Temp 98 F (36.7 C)   Resp 18   Ht 5' 2 (1.575 m)   Wt 61.2 kg   SpO2 99%   BMI 24.69 kg/m   Physical Exam Vitals and nursing note reviewed.  Constitutional:      General: She is not in acute distress.    Appearance: She is well-developed.  HENT:     Head: Normocephalic and atraumatic.  Eyes:     Conjunctiva/sclera: Conjunctivae normal.  Cardiovascular:     Rate and Rhythm: Normal rate and regular rhythm.  Pulmonary:     Effort: Pulmonary effort is normal. No respiratory distress.  Breath sounds: Normal breath sounds.  Abdominal:     Palpations: Abdomen is soft.     Tenderness: There is abdominal tenderness in the left upper quadrant and left lower quadrant.  Musculoskeletal:        General: No swelling.     Cervical back: Neck supple.  Skin:    General: Skin is warm and dry.     Capillary Refill: Capillary refill takes less than 2 seconds.  Neurological:     Mental Status: She is alert.  Psychiatric:        Mood and Affect: Mood normal.     (all labs ordered are listed, but only abnormal results are displayed) Labs Reviewed  COMPREHENSIVE METABOLIC PANEL WITH GFR - Abnormal; Notable for the following components:       Result Value   Sodium 134 (*)    Potassium 2.9 (*)    CO2 17 (*)    Glucose, Bld 101 (*)    Albumin 3.0 (*)    AST 65 (*)    ALT 61 (*)    Anion gap 17 (*)    All other components within normal limits  URINALYSIS, ROUTINE W REFLEX MICROSCOPIC - Abnormal; Notable for the following components:   APPearance HAZY (*)    Hgb urine dipstick SMALL (*)    Ketones, ur 5 (*)    Protein, ur 30 (*)    Nitrite POSITIVE (*)    Leukocytes,Ua LARGE (*)    Bacteria, UA MANY (*)    All other components within normal limits  I-STAT CHEM 8, ED - Abnormal; Notable for the following components:   Sodium 134 (*)    Potassium 2.9 (*)    Creatinine, Ser 0.40 (*)    Calcium, Ion 1.03 (*)    All other components within normal limits  URINE CULTURE  LIPASE, BLOOD  CBC  HCG, SERUM, QUALITATIVE    EKG: EKG Interpretation Date/Time:  Saturday March 02 2024 23:55:45 EDT Ventricular Rate:  78 PR Interval:  146 QRS Duration:  100 QT Interval:  404 QTC Calculation: 461 R Axis:   43  Text Interpretation: Sinus rhythm Probable left atrial enlargement No significant change was found Confirmed by Carita Senior [phone]) on 03/03/2024 12:12:41 AM  Radiology: CT ABDOMEN PELVIS W CONTRAST Result Date: 03/03/2024 CLINICAL DATA:  Acute nonlocalized abdominal pain. Vomiting. Constipation. EXAM: CT ABDOMEN AND PELVIS WITH CONTRAST TECHNIQUE: Multidetector CT imaging of the abdomen and pelvis was performed using the standard protocol following bolus administration of intravenous contrast. RADIATION DOSE REDUCTION: This exam was performed according to the departmental dose-optimization program which includes automated exposure control, adjustment of the mA and/or kV according to patient size and/or use of iterative reconstruction technique. CONTRAST:  75mL OMNIPAQUE  IOHEXOL  350 MG/ML SOLN COMPARISON:  Chest radiograph 11/19/2023. CT chest abdomen and pelvis 09/18/2020 FINDINGS: Lower chest: Lung bases are clear.  Hepatobiliary: Diffuse fatty infiltration of the liver. No focal lesions. Gallbladder and bile ducts are normal. Pancreas: Unremarkable. No pancreatic ductal dilatation or surrounding inflammatory changes. Spleen: Normal in size without focal abnormality. Adrenals/Urinary Tract: Adrenal glands are unremarkable. Kidneys are normal, without renal calculi, focal lesion, or hydronephrosis. Bladder wall is mildly thickened possibly indicating cystitis. No bladder stones. Stomach/Bowel: Stomach, small bowel, and colon are not abnormally distended. Colonic diverticulosis. Scarring or stranding around the mid descending colon. Although improved, this remains present since prior study. This could represent scarring related to healing of prior acute process or possibly recurrent diverticulitis. Underdistention limits visualization of  the colon but underlying colonic mass lesion should be considered. Colonoscopy suggested for further evaluation. Vascular/Lymphatic: No significant vascular findings are present. No enlarged abdominal or pelvic lymph nodes. Reproductive: Uterus is not enlarged. Bilateral ovarian cysts. Largest on the right measures 2.7 cm diameter. No significant change since prior study. Other: No free air or free fluid in the abdomen. Abdominal wall musculature appears intact. Musculoskeletal: No acute or significant osseous findings. IMPRESSION: 1. Colonic diverticulosis. Scarring or infiltration around the mid descending colon. Stranding is improved since prior study but remains present. This could indicate scarring related to prior acute process or recurrent diverticulitis in the same area. Underlying colon neoplasm is not excluded and colonoscopy is suggested. No abscess or proximal obstruction. 2. Fatty infiltration of the liver. 3. Bilateral ovarian cysts measuring up to 2.7 cm on the right. No change since prior study. No follow-up imaging recommended. Note: This recommendation does not apply to  premenarchal patients and to those with increased risk (genetic, family history, elevated tumor markers or other high-risk factors) of ovarian cancer. Reference: JACR 2020 Feb; 17(2):248-254 4. Mild bladder wall thickening may indicate cystitis. Correlate with urinalysis. Electronically Signed   By: Elsie Gravely M.D.   On: 03/03/2024 01:22     Procedures   Medications Ordered in the ED  morphine  (PF) 4 MG/ML injection 4 mg (4 mg Intravenous Given 03/03/24 0028)  ondansetron  (ZOFRAN ) injection 4 mg (4 mg Intravenous Given 03/03/24 0027)  sodium chloride  0.9 % bolus 1,000 mL (0 mLs Intravenous Stopped 03/03/24 0147)  iohexol  (OMNIPAQUE ) 350 MG/ML injection 75 mL (75 mLs Intravenous Contrast Given 03/03/24 0103)  HYDROmorphone  (DILAUDID ) injection 2 mg (2 mg Intravenous Given 03/03/24 0148)                                    Medical Decision Making Amount and/or Complexity of Data Reviewed Labs: ordered.  Risk Prescription drug management.   This patient presents to the ED for concern of abdominal pain, this involves an extensive number of treatment options, and is a complaint that carries with it a high risk of complications and morbidity.  The differential diagnosis includes SBO, gastroenteritis, colitis, appendicitis, cholecystitis, others   Co morbidities / Chronic conditions that complicate the patient evaluation  None   Additional history obtained:  Additional history obtained from EMR   Lab Tests:  I Ordered, and personally interpreted labs.  The pertinent results include:  Mild hypokalemia with a potassium of 2.9, negative pregnancy test, UA positive for nitrites, leukocytes, many bacteria   Imaging Studies ordered:  I ordered imaging studies including CT abdomen pelvis with contrast I independently visualized and interpreted imaging which showed  1. Colonic diverticulosis. Scarring or infiltration around the mid  descending colon. Stranding is improved since prior  study but  remains present. This could indicate scarring related to prior acute  process or recurrent diverticulitis in the same area. Underlying  colon neoplasm is not excluded and colonoscopy is suggested. No  abscess or proximal obstruction.  2. Fatty infiltration of the liver.  3. Bilateral ovarian cysts measuring up to 2.7 cm on the right. No  change since prior study. No follow-up imaging recommended. Note:  This recommendation does not apply to premenarchal patients and to  those with increased risk (genetic, family history, elevated tumor  markers or other high-risk factors) of ovarian cancer. Reference:  JACR 2020 Feb; 17(2):248-254  4. Mild bladder  wall thickening may indicate cystitis. Correlate  with urinalysis.   I agree with the radiologist interpretation   Cardiac Monitoring: / EKG:  The patient was maintained on a cardiac monitor.  I personally viewed and interpreted the cardiac monitored which showed an underlying rhythm of: Sinus rhythm   Problem List / ED Course / Critical interventions / Medication management   I ordered medication including morphine , Zofran , saline bolus, Dilaudid  Reevaluation of the patient after these medicines showed that the patient improved I have reviewed the patients home medicines and have made adjustments as needed   Social Determinants of Health:  Patient is a former smoker, has Medicaid for her primary health insurance type   Test / Admission - Considered:  Patient with CT evidence of cystitis and possible diverticulitis.  Diverticulitis does fit the picture of the left sided abdominal pain and diarrhea.  She denies any blood in her diarrhea.  She is afebrile and not septic.  She is currently tolerating p.o. intake and states she would like to go home.  I did discuss possible admission as I am concerned her pain may return but patient feels comfortable going home with prescription for antiemetics, short course of pain medication.   Will also prescribe antibiotics for the patient's urinary tract infection.  I discussed admission with the patient but she continues to want to go home.  Return precautions provided.      Final diagnoses:  Acute cystitis with hematuria  Acute diverticulitis    ED Discharge Orders          Ordered    cephALEXin  (KEFLEX ) 500 MG capsule  4 times daily        03/03/24 0353    oxyCODONE  (ROXICODONE ) 5 MG immediate release tablet  Every 4 hours PRN        03/03/24 0355    ondansetron  (ZOFRAN -ODT) 4 MG disintegrating tablet  Every 8 hours PRN        03/03/24 0355               Logan Ubaldo NOVAK, PA-C 03/03/24 0358    Carita Senior, MD 03/03/24 [phone]

## 2024-03-03 NOTE — Discharge Instructions (Signed)
 You were diagnosed tonight with diverticulitis.  Please take the prescribed pain medication and nausea medication.  You were also diagnosed tonight with a urinary tract infection.  I have prescribed a course of antibiotics.  Follow-up with your primary care provider as needed.  If you develop any life-threatening symptoms please return to the emergency department.

## 2024-03-05 LAB — URINE CULTURE: Culture: >=100000 — AB

## 2024-03-06 ENCOUNTER — Telehealth (HOSPITAL_BASED_OUTPATIENT_CLINIC_OR_DEPARTMENT_OTHER): Payer: Self-pay

## 2024-03-06 NOTE — Telephone Encounter (Signed)
 Post ED Visit - Positive Culture Follow-up  Culture report reviewed by antimicrobial stewardship pharmacist: [facility] [x]  Leonor Bash, [place].D. []  Venetia Gully, Pharm.D., BCPS AQ-ID []  Garrel Crews, Pharm.D., BCPS []  Almarie Lunger, Pharm.D., BCPS []  [place], [address].D., BCPS, AAHIVP []  Rosaline Bihari, Pharm.D., BCPS, AAHIVP []  Vernell Meier, PharmD, BCPS []  Latanya Hint, PharmD, BCPS []  Donald Medley, PharmD, BCPS []  Rocky Bold, PharmD []  Dorothyann Alert, PharmD, BCPS []  Morene Babe, PharmD  [facility] []  Rosaline Edison, PharmD []  Romona Bliss, PharmD []  Dolphus Roller, PharmD []  Veva Seip, Rph []  Vernell Daunt) Leonce, PharmD []  Eva Allis, PharmD []  Rosaline Millet, PharmD []  Iantha Batch, PharmD []  Arvin Gauss, PharmD []  Wanda Hasting, PharmD []  Ronal Rav, PharmD []  Rocky Slade, PharmD []  Bard Jeans, PharmD   Positive urine culture Treated with Cephalexin , organism sensitive to the same and no further patient follow-up is required at this time.  Kristi Kelley 03/06/2024, 11:19 AM

## 2024-03-09 DIAGNOSIS — Z419 Encounter for procedure for purposes other than remedying health state, unspecified: Secondary | ICD-10-CM | POA: Diagnosis not present

## 2024-04-09 DIAGNOSIS — Z419 Encounter for procedure for purposes other than remedying health state, unspecified: Secondary | ICD-10-CM | POA: Diagnosis not present

## 2024-05-10 DIAGNOSIS — Z419 Encounter for procedure for purposes other than remedying health state, unspecified: Secondary | ICD-10-CM | POA: Diagnosis not present
